# Patient Record
Sex: Male | Born: 1970 | Race: White | Hispanic: No | Marital: Married | Smoking: Never smoker
Health system: Southern US, Community
[De-identification: ages and names within clinical notes are randomized; demographics above are authoritative.]

## PROBLEM LIST (undated history)

## (undated) DIAGNOSIS — K219 Gastro-esophageal reflux disease without esophagitis: Secondary | ICD-10-CM

## (undated) DIAGNOSIS — R519 Headache, unspecified: Secondary | ICD-10-CM

## (undated) DIAGNOSIS — I10 Essential (primary) hypertension: Secondary | ICD-10-CM

## (undated) DIAGNOSIS — M1990 Unspecified osteoarthritis, unspecified site: Secondary | ICD-10-CM

## (undated) HISTORY — PX: CARPAL TUNNEL RELEASE: SHX101

---

## 2010-04-24 ENCOUNTER — Emergency Department: Payer: Self-pay | Admitting: Emergency Medicine

## 2010-05-21 ENCOUNTER — Ambulatory Visit: Payer: Self-pay | Admitting: Family Medicine

## 2013-12-02 ENCOUNTER — Ambulatory Visit: Payer: Self-pay | Admitting: Physician Assistant

## 2014-01-09 ENCOUNTER — Emergency Department: Payer: Self-pay | Admitting: Emergency Medicine

## 2014-01-09 LAB — CBC WITH DIFFERENTIAL/PLATELET
Basophil #: 0 10*3/uL (ref 0.0–0.1)
Basophil %: 0.5 %
EOS PCT: 2.2 %
Eosinophil #: 0.1 10*3/uL (ref 0.0–0.7)
HCT: 45.5 % (ref 40.0–52.0)
HGB: 15.5 g/dL (ref 13.0–18.0)
Lymphocyte #: 1.8 10*3/uL (ref 1.0–3.6)
Lymphocyte %: 30.9 %
MCH: 29.7 pg (ref 26.0–34.0)
MCHC: 34.1 g/dL (ref 32.0–36.0)
MCV: 87 fL (ref 80–100)
MONO ABS: 0.5 x10 3/mm (ref 0.2–1.0)
MONOS PCT: 9 %
Neutrophil #: 3.3 10*3/uL (ref 1.4–6.5)
Neutrophil %: 57.4 %
Platelet: 204 10*3/uL (ref 150–440)
RBC: 5.24 10*6/uL (ref 4.40–5.90)
RDW: 13.4 % (ref 11.5–14.5)
WBC: 5.8 10*3/uL (ref 3.8–10.6)

## 2014-01-09 LAB — URINALYSIS, COMPLETE
BACTERIA: NONE SEEN
BILIRUBIN, UR: NEGATIVE
Blood: NEGATIVE
GLUCOSE, UR: NEGATIVE mg/dL (ref 0–75)
KETONE: NEGATIVE
LEUKOCYTE ESTERASE: NEGATIVE
Nitrite: NEGATIVE
PROTEIN: NEGATIVE
Ph: 7 (ref 4.5–8.0)
RBC, UR: NONE SEEN /HPF (ref 0–5)
SPECIFIC GRAVITY: 1.004 (ref 1.003–1.030)
Squamous Epithelial: NONE SEEN
WBC UR: NONE SEEN /HPF (ref 0–5)

## 2014-01-09 LAB — COMPREHENSIVE METABOLIC PANEL
AST: 38 U/L — AB (ref 15–37)
Albumin: 3.8 g/dL (ref 3.4–5.0)
Alkaline Phosphatase: 96 U/L
Anion Gap: 1 — ABNORMAL LOW (ref 7–16)
BUN: 11 mg/dL (ref 7–18)
Bilirubin,Total: 0.3 mg/dL (ref 0.2–1.0)
CALCIUM: 9.2 mg/dL (ref 8.5–10.1)
Chloride: 105 mmol/L (ref 98–107)
Co2: 31 mmol/L (ref 21–32)
Creatinine: 0.97 mg/dL (ref 0.60–1.30)
EGFR (African American): >60
GLUCOSE: 85 mg/dL (ref 65–99)
OSMOLALITY: 272 (ref 275–301)
Potassium: 4.2 mmol/L (ref 3.5–5.1)
SGPT (ALT): 70 U/L (ref 12–78)
Sodium: 137 mmol/L (ref 136–145)
TOTAL PROTEIN: 7.5 g/dL (ref 6.4–8.2)

## 2014-01-09 LAB — LIPASE, BLOOD: Lipase: 142 U/L (ref 73–393)

## 2014-01-09 LAB — TROPONIN I: Troponin-I: <0.02 ng/mL

## 2014-01-10 ENCOUNTER — Encounter: Payer: Self-pay | Admitting: Emergency Medicine

## 2014-01-20 ENCOUNTER — Encounter: Payer: Self-pay | Admitting: Emergency Medicine

## 2014-01-21 ENCOUNTER — Emergency Department: Payer: Self-pay | Admitting: Emergency Medicine

## 2014-01-21 LAB — CBC WITH DIFFERENTIAL/PLATELET
Basophil #: 0 10*3/uL (ref 0.0–0.1)
Basophil %: 0.5 %
Eosinophil #: 0.1 10*3/uL (ref 0.0–0.7)
Eosinophil %: 2 %
HCT: 45.5 % (ref 40.0–52.0)
HGB: 15.6 g/dL (ref 13.0–18.0)
Lymphocyte #: 1.9 10*3/uL (ref 1.0–3.6)
Lymphocyte %: 29.3 %
MCH: 30.1 pg (ref 26.0–34.0)
MCHC: 34.3 g/dL (ref 32.0–36.0)
MCV: 88 fL (ref 80–100)
Monocyte #: 0.5 x10 3/mm (ref 0.2–1.0)
Monocyte %: 8.4 %
Neutrophil #: 3.9 10*3/uL (ref 1.4–6.5)
Neutrophil %: 59.8 %
Platelet: 203 10*3/uL (ref 150–440)
RBC: 5.2 10*6/uL (ref 4.40–5.90)
RDW: 13.4 % (ref 11.5–14.5)
WBC: 6.5 10*3/uL (ref 3.8–10.6)

## 2014-01-21 LAB — COMPREHENSIVE METABOLIC PANEL
Albumin: 4.1 g/dL (ref 3.4–5.0)
Alkaline Phosphatase: 95 U/L
Anion Gap: 5 — ABNORMAL LOW (ref 7–16)
BUN: 11 mg/dL (ref 7–18)
Bilirubin,Total: 0.4 mg/dL (ref 0.2–1.0)
Calcium, Total: 8.6 mg/dL (ref 8.5–10.1)
Chloride: 107 mmol/L (ref 98–107)
Co2: 27 mmol/L (ref 21–32)
Creatinine: 0.92 mg/dL (ref 0.60–1.30)
EGFR (African American): >60
EGFR (Non-African Amer.): >60
Glucose: 81 mg/dL (ref 65–99)
Osmolality: 276 (ref 275–301)
Potassium: 4 mmol/L (ref 3.5–5.1)
SGOT(AST): 39 U/L — ABNORMAL HIGH (ref 15–37)
SGPT (ALT): 63 U/L (ref 12–78)
Sodium: 139 mmol/L (ref 136–145)
Total Protein: 8.1 g/dL (ref 6.4–8.2)

## 2014-01-21 LAB — URINALYSIS, COMPLETE
BILIRUBIN, UR: NEGATIVE
Bacteria: NONE SEEN
Blood: NEGATIVE
Glucose,UR: NEGATIVE mg/dL (ref 0–75)
Ketone: NEGATIVE
Leukocyte Esterase: NEGATIVE
NITRITE: NEGATIVE
PH: 6 (ref 4.5–8.0)
Protein: NEGATIVE
RBC,UR: NONE SEEN /HPF (ref 0–5)
SPECIFIC GRAVITY: 1.004 (ref 1.003–1.030)
Squamous Epithelial: NONE SEEN
WBC UR: NONE SEEN /HPF (ref 0–5)

## 2014-01-21 LAB — LIPASE, BLOOD: LIPASE: 164 U/L (ref 73–393)

## 2014-02-14 ENCOUNTER — Ambulatory Visit: Payer: Self-pay | Admitting: Gastroenterology

## 2014-02-16 LAB — PATHOLOGY REPORT

## 2014-02-20 ENCOUNTER — Encounter: Payer: Self-pay | Admitting: Emergency Medicine

## 2014-03-22 ENCOUNTER — Encounter: Payer: Self-pay | Admitting: Emergency Medicine

## 2015-05-17 ENCOUNTER — Encounter: Payer: Self-pay | Admitting: Emergency Medicine

## 2015-05-17 ENCOUNTER — Emergency Department
Admission: EM | Admit: 2015-05-17 | Discharge: 2015-05-17 | Disposition: A | Discharge status: Home / Self Care | Payer: BLUE CROSS/BLUE SHIELD | Attending: Emergency Medicine | Admitting: Emergency Medicine

## 2015-05-17 ENCOUNTER — Emergency Department: Payer: BLUE CROSS/BLUE SHIELD

## 2015-05-17 DIAGNOSIS — W450XXA Nail entering through skin, initial encounter: Secondary | ICD-10-CM | POA: Diagnosis not present

## 2015-05-17 DIAGNOSIS — Y998 Other external cause status: Secondary | ICD-10-CM | POA: Insufficient documentation

## 2015-05-17 DIAGNOSIS — Y9289 Other specified places as the place of occurrence of the external cause: Secondary | ICD-10-CM | POA: Diagnosis not present

## 2015-05-17 DIAGNOSIS — Y9389 Activity, other specified: Secondary | ICD-10-CM | POA: Diagnosis not present

## 2015-05-17 DIAGNOSIS — T148XXA Other injury of unspecified body region, initial encounter: Secondary | ICD-10-CM

## 2015-05-17 DIAGNOSIS — I10 Essential (primary) hypertension: Secondary | ICD-10-CM | POA: Diagnosis not present

## 2015-05-17 DIAGNOSIS — S51032A Puncture wound without foreign body of left elbow, initial encounter: Secondary | ICD-10-CM | POA: Diagnosis not present

## 2015-05-17 HISTORY — DX: Essential (primary) hypertension: I10

## 2015-05-17 MED ORDER — KETOROLAC TROMETHAMINE 60 MG/2ML IM SOLN
INTRAMUSCULAR | Status: AC
  Administered 2015-05-17: 60 mg via INTRAMUSCULAR
  Filled 2015-05-17: qty 2

## 2015-05-17 MED ORDER — BACITRACIN ZINC 500 UNIT/GM EX OINT
TOPICAL_OINTMENT | CUTANEOUS | Status: AC
  Filled 2015-05-17: qty 0.9

## 2015-05-17 MED ORDER — CEPHALEXIN 500 MG PO CAPS: 500.0000 mg | ORAL_CAPSULE | Freq: Four times a day (QID) | ORAL | Status: DC

## 2015-05-17 MED ORDER — KETOROLAC TROMETHAMINE 60 MG/2ML IM SOLN
60.0000 mg | Freq: Once | INTRAMUSCULAR | Status: AC
  Administered 2015-05-17: 60 mg via INTRAMUSCULAR

## 2015-05-17 MED ORDER — IBUPROFEN 800 MG PO TABS: 800.0000 mg | ORAL_TABLET | Freq: Three times a day (TID) | ORAL | Status: DC | PRN

## 2015-05-17 NOTE — ED Notes (Signed)
When pulling shelving in his building, pt states a nail punctured the under side of his left upper arm, when he jerked back, he pulled the nail out and said there was a lot of blood. Pt appears pale. Site wrapped with a towel on initial assessment. When towel removed, puncture site small, bleeding controlled, swelling around area noted. Gauze placed on site, area wrapped with kerlex. Pt states tetanus shot is less than 45 years old.

## 2015-05-17 NOTE — Discharge Instructions (Signed)
Puncture Wound °A puncture wound is an injury that extends through all layers of the skin and into the tissue beneath the skin (subcutaneous tissue). Puncture wounds become infected easily because germs often enter the body and go beneath the skin during the injury. Having a deep wound with a small entrance point makes it difficult for your caregiver to adequately clean the wound. This is especially true if you have stepped on a nail and it has passed through a dirty shoe or other situations where the wound is obviously contaminated. °CAUSES  °Many puncture wounds involve glass, nails, splinters, fish hooks, or other objects that enter the skin (foreign bodies). A puncture wound may also be caused by a human bite or animal bite. °DIAGNOSIS  °A puncture wound is usually diagnosed by your history and a physical exam. You may need to have an X-ray or an ultrasound to check for any foreign bodies still in the wound. °TREATMENT  °· Your caregiver will clean the wound as thoroughly as possible. Depending on the location of the wound, a bandage (dressing) may be applied. °· Your caregiver might prescribe antibiotic medicines. °· You may need a follow-up visit to check on your wound. Follow all instructions as directed by your caregiver. °HOME CARE INSTRUCTIONS  °· Change your dressing once per day, or as directed by your caregiver. If the dressing sticks, it may be removed by soaking the area in water. °· If your caregiver has given you follow-up instructions, it is very important that you return for a follow-up appointment. Not following up as directed could result in a chronic or permanent injury, pain, and disability. °· Only take over-the-counter or prescription medicines for pain, discomfort, or fever as directed by your caregiver. °· If you are given antibiotics, take them as directed. Finish them even if you start to feel better. °You may need a tetanus shot if: °· You cannot remember when you had your last tetanus  shot. °· You have never had a tetanus shot. °If you got a tetanus shot, your arm may swell, get red, and feel warm to the touch. This is common and not a problem. If you need a tetanus shot and you choose not to have one, there is a rare chance of getting tetanus. Sickness from tetanus can be serious. °You may need a rabies shot if an animal bite caused your puncture wound. °SEEK MEDICAL CARE IF:  °· You have redness, swelling, or increasing pain in the wound. °· You have red streaks going away from the wound. °· You notice a bad smell coming from the wound or dressing. °· You have yellowish-white fluid (pus) coming from the wound. °· You are treated with an antibiotic for infection, but the infection is not getting better. °· You notice something in the wound, such as rubber from your shoe, cloth, or another object. °· You have a fever. °· You have severe pain. °· You have difficulty breathing. °· You feel dizzy or faint. °· You cannot stop vomiting. °· You lose feeling, develop numbness, or cannot move a limb below the wound. °· Your symptoms worsen. °MAKE SURE YOU: °· Understand these instructions. °· Will watch your condition. °· Will get help right away if you are not doing well or get worse. °Document Released: 08/18/2005 Document Revised: 01/31/2012 Document Reviewed: 04/27/2011 °ExitCare® Patient Information ©2015 [employer]. This information is not intended to replace advice given to you by your health care provider. Make sure you discuss any questions you   have with your health care provider. ° °

## 2015-05-17 NOTE — ED Provider Notes (Signed)
[facility] Emergency Department Provider Note  ____________________________________________  Time seen: Approximately 1:51 PM  I have reviewed the triage vital signs and the nursing notes.   HISTORY  Chief Complaint Arm Injury    HPI Alan Chapman. is a 44 y.o. male who presents for evaluation for puncture wound to his left upper arm. Patient states that he jerked his arm back after hitting the base of the nail. complains of increased pain and around the elbow.   Past Medical History  Diagnosis Date  . Hypertension     There are no active problems to display for this patient.   Past Surgical History  Procedure Laterality Date  . Carpal tunnel release      right hand    Current Outpatient Rx  Name  Route  Sig  Dispense  Refill  . cephALEXin (KEFLEX) 500 MG capsule   Oral   Take 1 capsule (500 mg total) by mouth 4 (four) times daily.   40 capsule   0   . ibuprofen (ADVIL,MOTRIN) 800 MG tablet   Oral   Take 1 tablet (800 mg total) by mouth every 8 (eight) hours as needed.   30 tablet   0     Allergies Review of patient's allergies indicates no known allergies.  No family history on file.  Social History History  Substance Use Topics  . Smoking status: Never Smoker   . Smokeless tobacco: Not on file  . Alcohol Use: No    Review of Systems Constitutional: No fever/chills Eyes: No visual changes. ENT: No sore throat. Cardiovascular: Denies chest pain. Respiratory: Denies shortness of breath. Gastrointestinal: No abdominal pain.  No nausea, no vomiting.  No diarrhea.  No constipation. Genitourinary: Negative for dysuria. Musculoskeletal: Left elbow pain Skin: Positive for puncture wound left elbow Neurological: Negative for headaches, focal weakness or numbness.  10-point ROS otherwise negative.  ____________________________________________   PHYSICAL EXAM:  VITAL SIGNS: ED Triage Vitals  Enc Vitals Group   BP 05/17/15 1245 129/87 mmHg     Pulse Rate 05/17/15 1245 70     Resp 05/17/15 1245 18     Temp 05/17/15 1245 98 F (36.7 C)     Temp Source 05/17/15 1245 Oral     SpO2 05/17/15 1245 96 %     Weight 05/17/15 1245 230 lb (104.327 kg)     Height 05/17/15 1245  (1.727 m)     Head Cir --      Peak Flow --      Pain Score 05/17/15 1243 5     Pain Loc --      Pain Edu? --      Excl. in GC? --     Constitutional: Alert and oriented. Well appearing and in no acute distress. Musculoskeletal: No lower extremity tenderness nor edema.  No joint effusions. Neurologic:  Normal speech and language. No gross focal neurologic deficits are appreciated. Speech is normal. No gait instability. Skin:  Puncture wound left elbow pain control and no erythema positive ecchymosis noted Psychiatric: Mood and affect are normal. Speech and behavior are normal.  ____________________________________________   LABS (all labs ordered are listed, but only abnormal results are displayed)  Labs Reviewed - No data to display ____________________________________________  EKG  Deferred ____________________________________________  RADIOLOGY  Negative fracture by radiologist reviewed by myself ____________________________________________   PROCEDURES  Procedure(s) performed: None  Critical Care performed: No  ____________________________________________   INITIAL IMPRESSION / ASSESSMENT AND PLAN /  ED COURSE  Pertinent labs & imaging results that were available during my care of the patient were reviewed by me and considered in my medical decision making (see chart for details).  Puncture wound cleansed and bandaid applied with bacitracin by myself. Discharged home to follow-up as needed. ____________________________________________   FINAL CLINICAL IMPRESSION(S) / ED DIAGNOSES  Final diagnoses:  Puncture wound      Evangeline Dakin, PA-C 05/17/15 1547  Sharman Cheek, MD 05/17/15  1550

## 2015-10-26 IMAGING — US ABDOMEN ULTRASOUND LIMITED
1 series · 14 of 25 positions shown · non-contrast
Comparison: None.

CLINICAL DATA: Right upper quadrant abdominal pain.

EXAM:
US ABDOMEN LIMITED - RIGHT UPPER QUADRANT

[Series 1: abdomen ultrasound limited · 0.27mm/px · 14 of 41 slices shown]
[im 1/41]
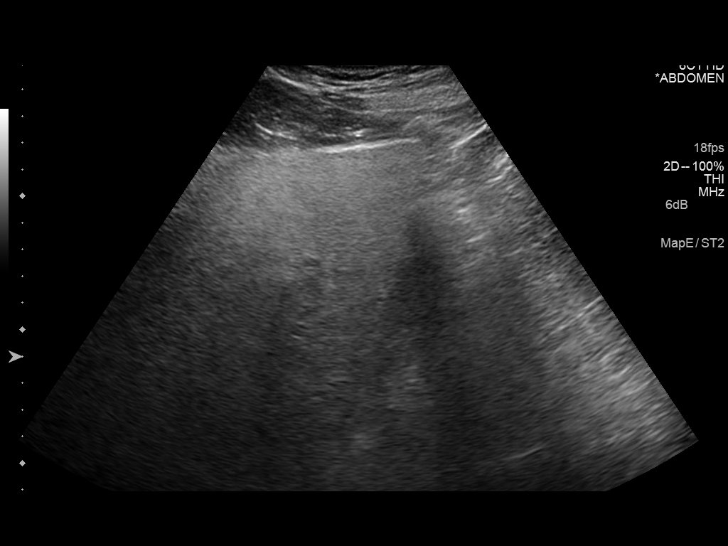
[im 4/41]
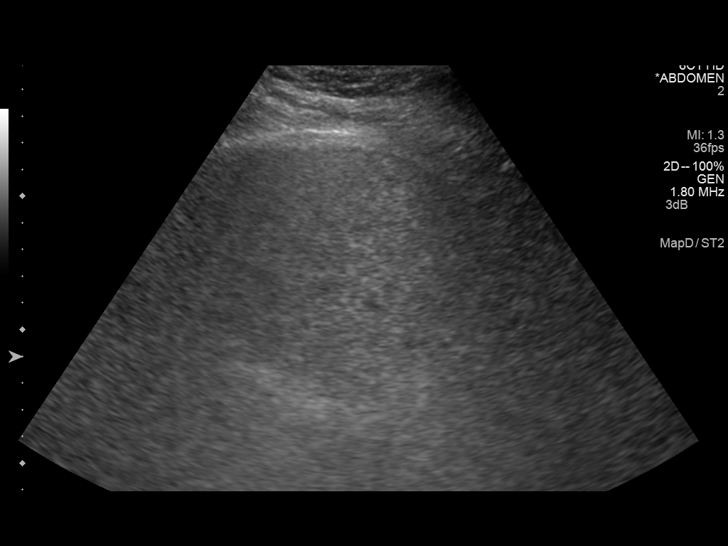
[im 7/41]
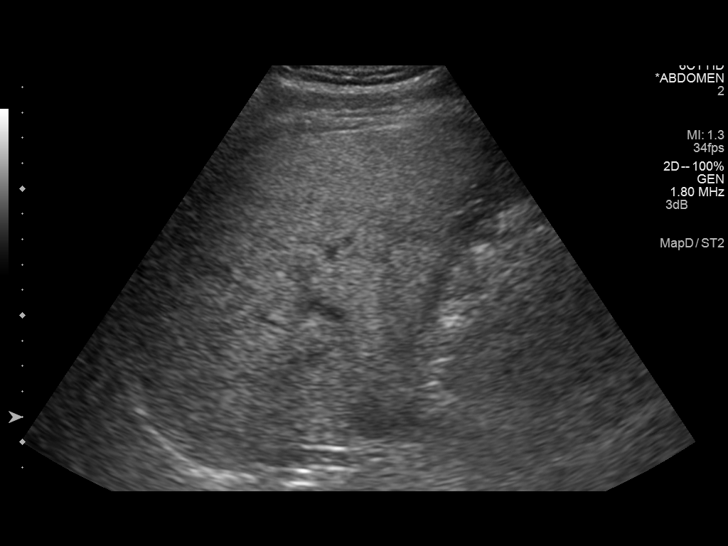
[im 11/41]
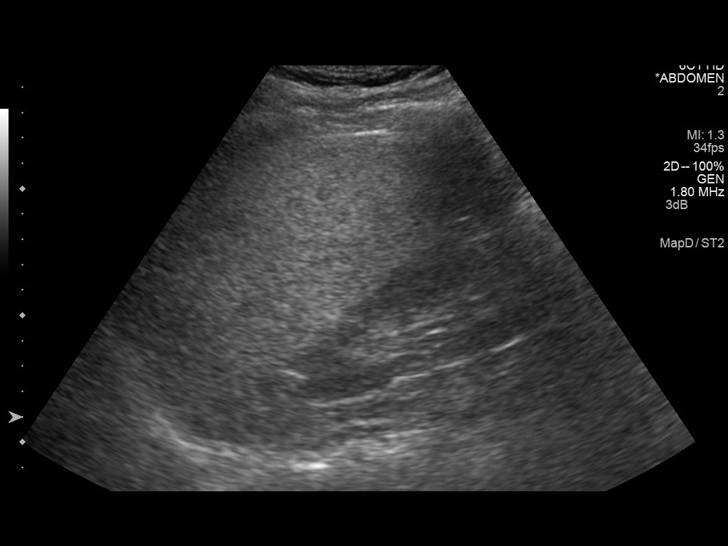
[im 14/41]
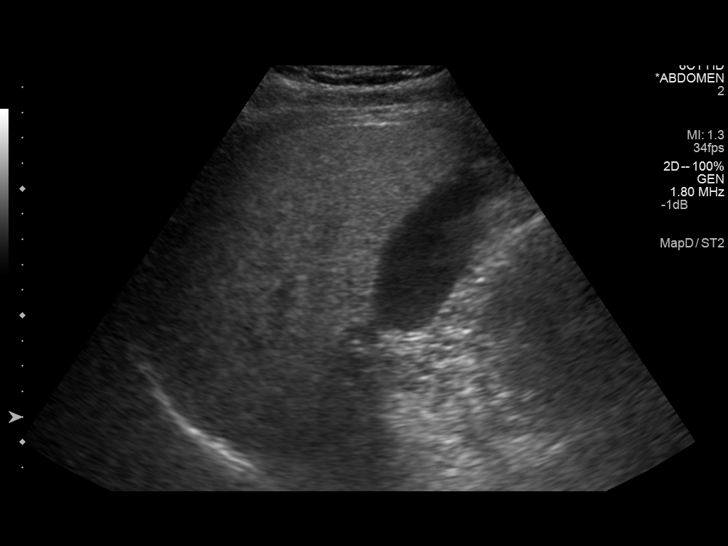
[im 16/41]
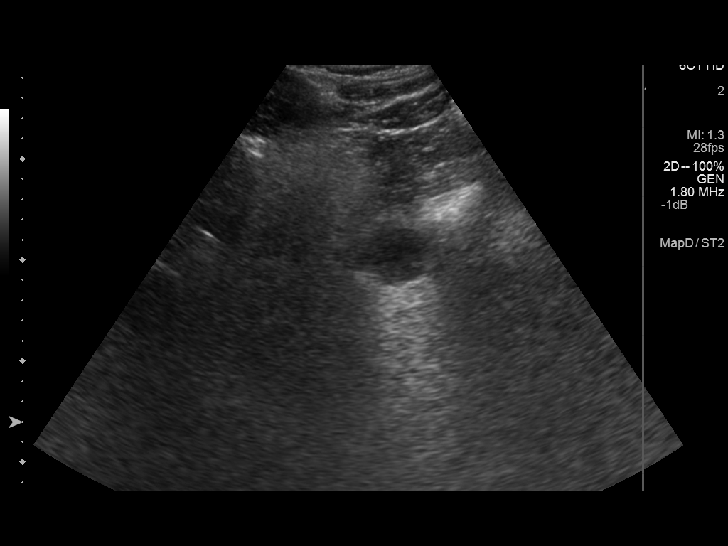
[im 19/41]
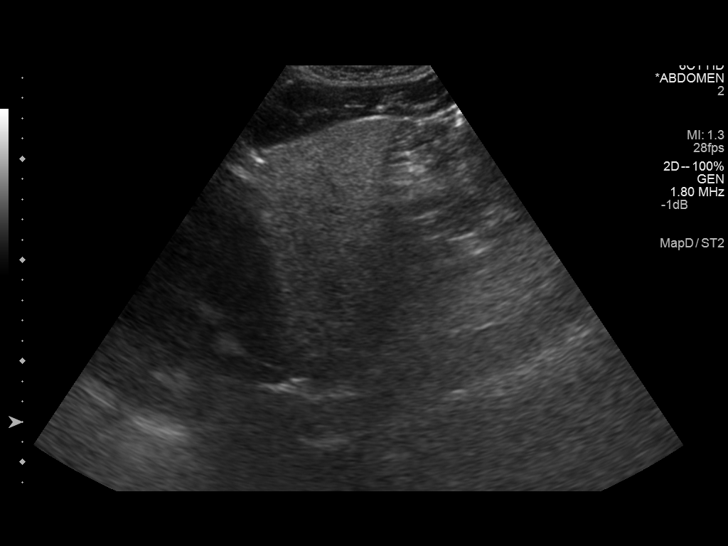
[im 22/41]
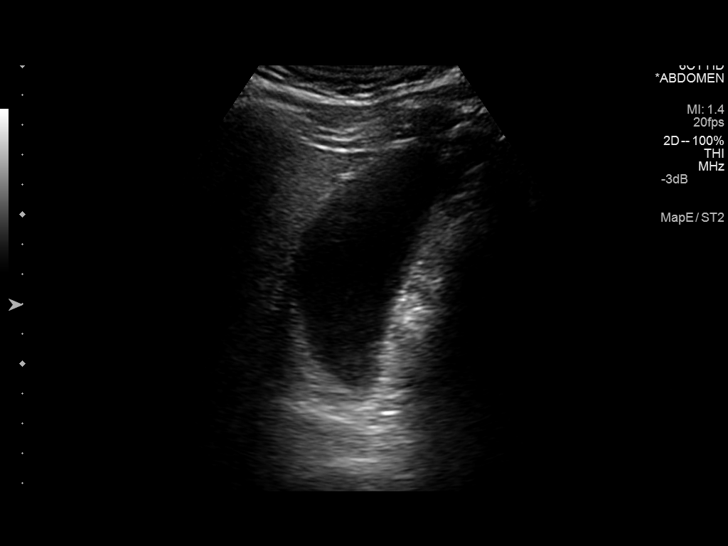
[im 26/41]
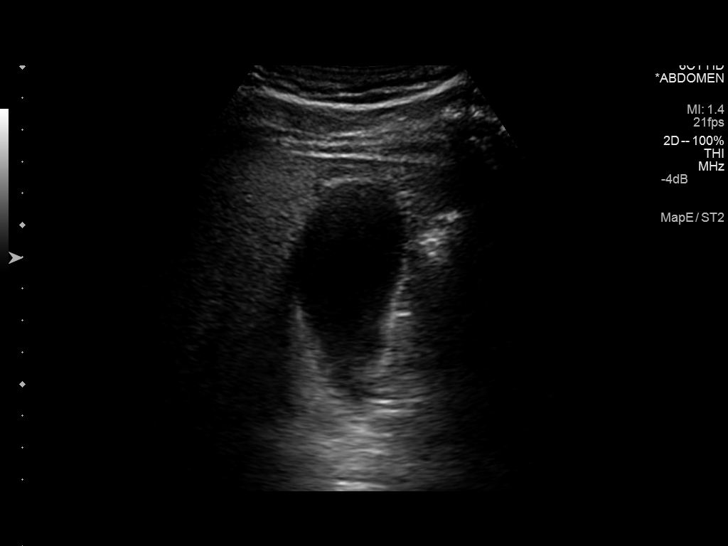
[im 27/41]
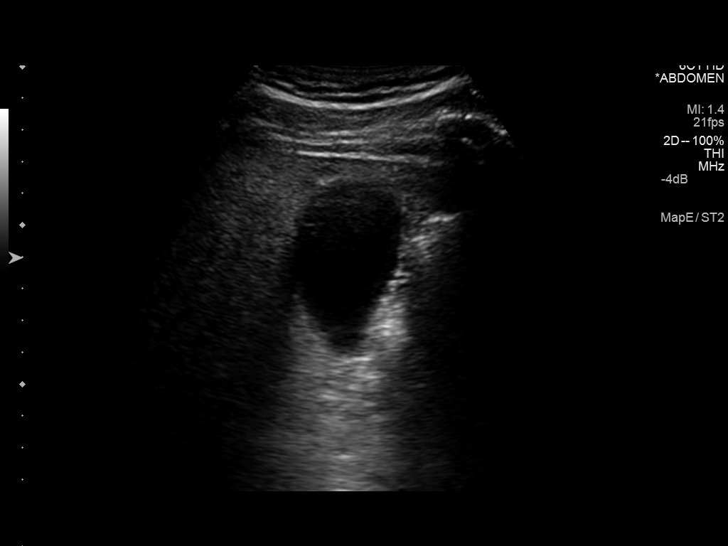
[im 31/41]
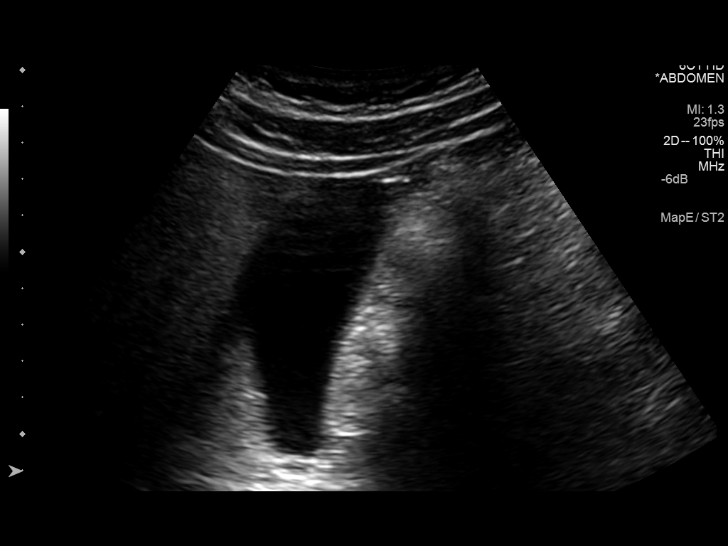
[im 34/41]
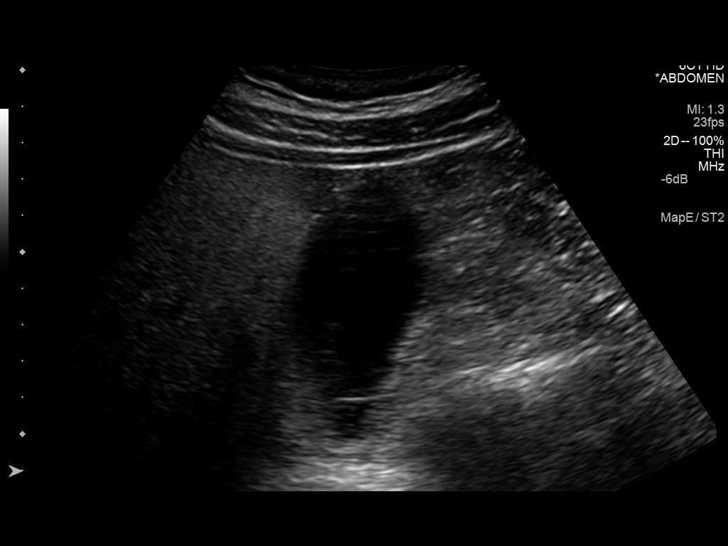
[im 37/41]
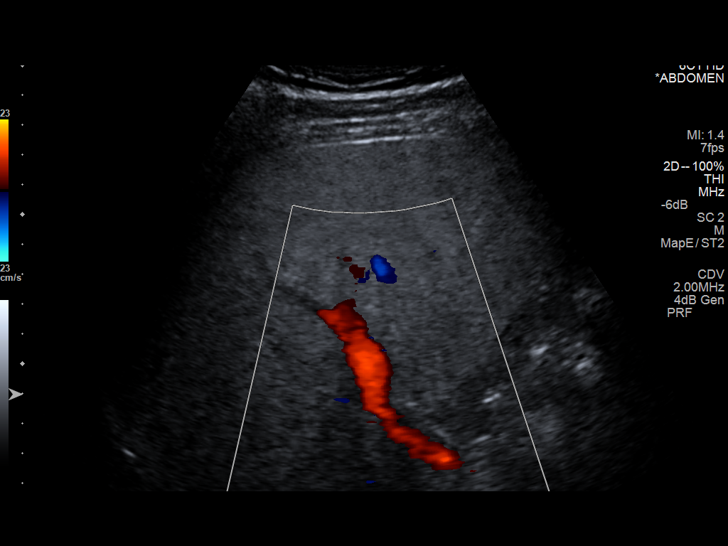
[im 41/41]
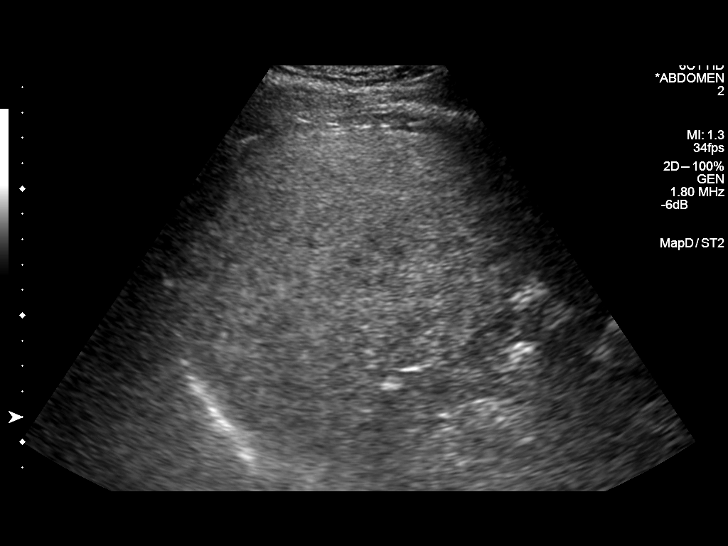

[14 of 25 positions shown; findings below may reference images not displayed]

FINDINGS: Gallbladder:

No gallstones or wall thickening visualized. No sonographic Murphy
sign noted. There may be a trace amount of biliary sludge in the
dependent gallbladder.

Common bile duct:

Diameter: Normal caliber of 5 mm.

Liver:

Diffusely increased echogenicity of the liver identified, consistent
with steatosis. No overt cirrhotic changes are identified. No
evidence of hepatic mass or biliary ductal dilatation. The portal
vein is open.
IMPRESSION: 1. No shadowing calculi in the gallbladder. There may be a trace
amount of biliary sludge in the gallbladder lumen.
2. Diffusely increased echogenicity of the liver consistent with
steatosis.

## 2020-02-04 ENCOUNTER — Other Ambulatory Visit: Payer: Self-pay

## 2020-02-04 ENCOUNTER — Emergency Department
Admission: EM | Admit: 2020-02-04 | Discharge: 2020-02-04 | Disposition: A | Discharge status: Home / Self Care | Payer: BC Managed Care – PPO | Attending: Emergency Medicine | Admitting: Emergency Medicine

## 2020-02-04 ENCOUNTER — Emergency Department: Payer: BC Managed Care – PPO

## 2020-02-04 ENCOUNTER — Encounter: Payer: Self-pay | Admitting: Emergency Medicine

## 2020-02-04 DIAGNOSIS — I10 Essential (primary) hypertension: Secondary | ICD-10-CM | POA: Insufficient documentation

## 2020-02-04 DIAGNOSIS — R42 Dizziness and giddiness: Secondary | ICD-10-CM | POA: Diagnosis not present

## 2020-02-04 DIAGNOSIS — R519 Headache, unspecified: Secondary | ICD-10-CM | POA: Insufficient documentation

## 2020-02-04 DIAGNOSIS — Z79899 Other long term (current) drug therapy: Secondary | ICD-10-CM | POA: Insufficient documentation

## 2020-02-04 LAB — CBC
HCT: 47.3 % (ref 39.0–52.0)
Hemoglobin: 16.2 g/dL (ref 13.0–17.0)
MCH: 29.9 pg (ref 26.0–34.0)
MCHC: 34.2 g/dL (ref 30.0–36.0)
MCV: 87.4 fL (ref 80.0–100.0)
Platelets: 219 10*3/uL (ref 150–400)
RBC: 5.41 MIL/uL (ref 4.22–5.81)
RDW: 12.4 % (ref 11.5–15.5)
WBC: 5.7 10*3/uL (ref 4.0–10.5)
nRBC: 0 % (ref 0.0–0.2)

## 2020-02-04 LAB — URINALYSIS, COMPLETE (UACMP) WITH MICROSCOPIC
Bacteria, UA: NONE SEEN
Bilirubin Urine: NEGATIVE
Glucose, UA: NEGATIVE mg/dL
Hgb urine dipstick: NEGATIVE
Ketones, ur: NEGATIVE mg/dL
Leukocytes,Ua: NEGATIVE
Nitrite: NEGATIVE
Protein, ur: NEGATIVE mg/dL
Specific Gravity, Urine: 1.015 (ref 1.005–1.030)
Squamous Epithelial / HPF: NONE SEEN (ref 0–5)
pH: 7 (ref 5.0–8.0)

## 2020-02-04 LAB — BASIC METABOLIC PANEL
Anion gap: 8 (ref 5–15)
BUN: 11 mg/dL (ref 6–20)
CO2: 27 mmol/L (ref 22–32)
Calcium: 8.9 mg/dL (ref 8.9–10.3)
Chloride: 103 mmol/L (ref 98–111)
Creatinine, Ser: 1 mg/dL (ref 0.61–1.24)
GFR calc Af Amer: >60 mL/min (ref >60)
GFR calc non Af Amer: >60 mL/min (ref >60)
Glucose, Bld: 114 mg/dL — ABNORMAL HIGH (ref 70–99)
Potassium: 4.3 mmol/L (ref 3.5–5.1)
Sodium: 138 mmol/L (ref 135–145)

## 2020-02-04 LAB — GLUCOSE, CAPILLARY: Glucose-Capillary: 115 mg/dL — ABNORMAL HIGH (ref 70–99)

## 2020-02-04 MED ORDER — MECLIZINE HCL 25 MG PO TABS
25.0000 mg | ORAL_TABLET | Freq: Once | ORAL | Status: AC
  Administered 2020-02-04: 18:00:00 25 mg via ORAL
  Filled 2020-02-04: qty 1

## 2020-02-04 MED ORDER — SODIUM CHLORIDE 0.9 % IV BOLUS
1000.0000 mL | Freq: Once | INTRAVENOUS | Status: AC
  Administered 2020-02-04: 1000 mL via INTRAVENOUS

## 2020-02-04 MED ORDER — PREDNISONE 20 MG PO TABS
40.0000 mg | ORAL_TABLET | Freq: Once | ORAL | Status: AC
  Administered 2020-02-04: 40 mg via ORAL
  Filled 2020-02-04: qty 2

## 2020-02-04 MED ORDER — PREDNISONE 20 MG PO TABS: 40.0000 mg | ORAL_TABLET | Freq: Every day | ORAL | 0 refills | Status: AC

## 2020-02-04 MED ORDER — MECLIZINE HCL 25 MG PO TABS: 25.0000 mg | ORAL_TABLET | Freq: Three times a day (TID) | ORAL | 0 refills | Status: DC | PRN

## 2020-02-04 MED ORDER — DIAZEPAM 5 MG PO TABS
5.0000 mg | ORAL_TABLET | Freq: Once | ORAL | Status: AC
  Administered 2020-02-04: 5 mg via ORAL
  Filled 2020-02-04: qty 1

## 2020-02-04 MED ORDER — DIAZEPAM 5 MG PO TABS: 5.0000 mg | ORAL_TABLET | Freq: Two times a day (BID) | ORAL | 0 refills | Status: DC | PRN

## 2020-02-04 NOTE — ED Notes (Signed)
Pt ambulated to toilet without assistance with steady gait

## 2020-02-04 NOTE — ED Notes (Signed)
Pt speaking with Dr. Erma Heritage in no acute distress, speech clear. Pt reports if he is laying flat he feels fine but when he gets up he feels dizzy

## 2020-02-04 NOTE — Discharge Instructions (Signed)
Do not drive until symptom free for at least 48 hours  For your vertigo: - Take the prednisone as prescribed - For mild dizziness, take the meclizine - For SEVERE dizziness, take the valium and lie down. DO NOT DRIVE while taking either of these medications  Call an ENT for follow-up

## 2020-02-04 NOTE — ED Triage Notes (Signed)
Pt c/o dizziness starting today when he woke up.  Went to bed at 330 AM and did not have this.  Describes it as the room spinning.  Feels off balance walking because of the dizziness. Denies numbness, tingling or weakness.  Mild headache present.  Speech clear. No facial droop.

## 2020-02-04 NOTE — ED Provider Notes (Signed)
[facility] Emergency Department Provider Note  ____________________________________________   First MD Initiated Contact with Patient 02/04/20 1546     (approximate)  I have reviewed the triage vital signs and the nursing notes.   HISTORY  Chief Complaint Dizziness and Headache    HPI Alan Chapman. is a 49 y.o. male here with dizziness.  The patient states that for the last day, he has had episodes of severe dizziness whenever he sits up, changes position, or moves his head quickly.  He states that he begins to feel like the room is spinning.  He feels like he is off balance.  He has associated nausea.  His symptoms are new.  He denies any recent medication changes.  No recent head trauma.  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  He has not seen an ENT for this.  He states that he has been having some mild increase in pressure in the left ear recently as well.  Denies any recent sinus infections.  No fevers or chills.  No specific alleviating factors other than staying still.        Past Medical History:  Diagnosis Date  . Hypertension     There are no problems to display for this patient.   Past Surgical History:  Procedure Laterality Date  . CARPAL TUNNEL RELEASE     right hand    Prior to Admission medications   Medication Sig Start Date End Date Taking? Authorizing Provider  famotidine (PEPCID) 40 MG tablet Take 40 mg by mouth daily. 12/13/19  Yes [provider]  loratadine (CLARITIN) 10 MG tablet Take 10 mg by mouth daily.   Yes [provider]  losartan (COZAAR) 100 MG tablet Take 100 mg by mouth daily. 12/13/19  Yes [provider]  Multiple Vitamins-Minerals (MENS DAILY FORMULA/LYCOPENE PO) Take 1 tablet by mouth daily.   Yes [provider]  naproxen sodium (ALEVE) 220 MG tablet Take 220-440 mg by mouth 2 (two) times daily as needed (pain).   Yes [provider]    Allergies Patient has no known allergies.  History reviewed. No pertinent family history.  Social History Social History   Tobacco Use  . Smoking status: Never Smoker  . Smokeless tobacco: Never Used  Substance Use Topics  . Alcohol use: No  . Drug use: No    Review of Systems  Review of Systems  Constitutional: Positive for fatigue. Negative for chills and fever.  HENT: Negative for sore throat.   Respiratory: Negative for shortness of breath.   Cardiovascular: Negative for chest pain.  Gastrointestinal: Negative for abdominal pain.  Genitourinary: Negative for flank pain.  Musculoskeletal: Negative for neck pain.  Skin: Negative for rash and wound.  Allergic/Immunologic: Negative for immunocompromised state.  Neurological: Positive for dizziness. Negative for weakness and numbness.  Hematological: Does not bruise/bleed easily.  All other systems reviewed and are negative.    ____________________________________________  PHYSICAL EXAM:      VITAL SIGNS: ED Triage Vitals  Enc Vitals Group     BP 02/04/20 1337 (!) 145/92     Pulse Rate 02/04/20 1335 73     Resp 02/04/20 1335 18     Temp 02/04/20 1335 98.4 F (36.9 C)     Temp Source 02/04/20 1335 Oral     SpO2 02/04/20 1335 95 %     Weight 02/04/20 1336 241 lb (109.3 kg)  Height 02/04/20 1336 5\' 8"  (1.727 m)     Head Circumference --      Peak Flow --      Pain Score 02/04/20 1335 2     Pain Loc --      Pain Edu? --      Excl. in GC? --      Physical Exam Vitals and nursing note reviewed.  Constitutional:      General: He is not in acute distress.    Appearance: He is well-developed.  HENT:     Head: Normocephalic and atraumatic.  Eyes:     Conjunctiva/sclera: Conjunctivae normal.  Cardiovascular:     Rate and Rhythm: Normal rate and regular rhythm.     Heart sounds: Normal heart sounds.  Pulmonary:     Effort: Pulmonary effort is normal. No respiratory distress.     Breath  sounds: No wheezing.  Abdominal:     General: There is no distension.  Musculoskeletal:     Cervical back: Neck supple.  Skin:    General: Skin is warm.     Capillary Refill: Capillary refill takes less than 2 seconds.     Findings: No rash.  Neurological:     Mental Status: He is alert and oriented to person, place, and time.     Motor: No abnormal muscle tone.     Comments: Neurological Exam:  Mental Status: Alert and oriented to person, place, and time. Attention and concentration normal. Speech clear. Recent memory is intact. Cranial Nerves: Visual fields grossly intact. EOMI and PERRLA. No nystagmus noted. Facial sensation intact at forehead, maxillary cheek, and chin/mandible bilaterally. No facial asymmetry or weakness. Hearing grossly normal. Uvula is midline, and palate elevates symmetrically. Normal SCM and trapezius strength. Tongue midline without fasciculations. Motor: Muscle strength 5/5 in proximal and distal UE and LE bilaterally. No pronator drift. Muscle tone normal.  Sensation: Intact to light touch in upper and lower extremities distally bilaterally.  Gait: Normal without ataxia. Coordination: Normal FTN bilaterally.        On Dix-Hallpike testing, leftward beating, unidirectional nystagmus noted with latency and fatigue.  ____________________________________________   LABS (all labs ordered are listed, but only abnormal results are displayed)  Labs Reviewed  BASIC METABOLIC PANEL - Abnormal; Notable for the following components:      Result Value   Glucose, Bld 114 (*)    All other components within normal limits  URINALYSIS, COMPLETE (UACMP) WITH MICROSCOPIC - Abnormal; Notable for the following components:   Color, Urine YELLOW (*)    APPearance CLEAR (*)    All other components within normal limits  GLUCOSE, CAPILLARY - Abnormal; Notable for the following components:   Glucose-Capillary 115 (*)    All other components within normal limits  CBC  CBG  MONITORING, ED    ____________________________________________  EKG: Normal sinus rhythm, ventricular rate 64.  PR 162, QRS 74, QTc 389.  No acute ST elevations or depressions.  No ischemia or infarct. ________________________________________  RADIOLOGY All imaging, including plain films, CT scans, and ultrasounds, independently reviewed by me, and interpretations confirmed via formal radiology reads.  ED MD interpretation:   CT head: Negative  Official radiology report(s): CT Head Wo Contrast  Result Date: 02/04/2020 CLINICAL DATA:  Vertigo. EXAM: CT HEAD WITHOUT CONTRAST TECHNIQUE: Contiguous axial images were obtained from the base of the skull through the vertex without intravenous contrast. COMPARISON:  None. FINDINGS: Brain: No evidence of acute infarction, hemorrhage, hydrocephalus, extra-axial collection or mass lesion/mass  effect. Vascular: No hyperdense vessel or unexpected calcification. Skull: Normal. Negative for fracture or focal lesion. Sinuses/Orbits: No acute finding. Other: None. IMPRESSION: Normal head CT. Electronically Signed   By: Marijo Conception M.D.   On: 02/04/2020 16:54    ____________________________________________  PROCEDURES   Procedure(s) performed (including Critical Care):  Procedures  ____________________________________________  INITIAL IMPRESSION / MDM / [place] / ED COURSE  As part of my medical decision making, I reviewed the following data within the [place] notes reviewed and incorporated, Old chart reviewed, Notes from prior ED visits, and [place] Controlled Substance [place]. was evaluated in Emergency Department on 02/04/2020 for the symptoms described in the history of present illness. He was evaluated in the context of the global COVID-19 pandemic, which necessitated consideration that the patient might be at risk for infection with the SARS-CoV-2 virus that causes  COVID-19. Institutional protocols and algorithms that pertain to the evaluation of patients at risk for COVID-19 are in a state of rapid change based on information released by regulatory bodies including the CDC and federal and state organizations. These policies and algorithms were followed during the patient's care in the ED.  Some ED evaluations and interventions may be delayed as a result of limited staffing during the pandemic.*     Medical Decision Making: 49 year old male here with positional vertigo.  On Dix-Hallpike testing, he has unidirectional, horizontal nystagmus with latent onset that fatigues over several minutes.  He has no double vision, difficulty speaking, difficulty swallowing, ataxia, dysmetria, or evidence to suggest cerebellar abnormality and CT head is negative.  He has a lifelong history of tinnitus and intermittent ear pressure which I think is likely contributing to his middle ear dysfunction, possibly Mnire's disease.  He has demonstrated excellent response to prednisone and Valium here.  Will treat his vertigo as an outpatient and refer him for ENT follow-up.  Labs reassuring, lytes acceptable. No high risk medication use. No high risk features for stroke/central etiology. ____________________________________________  FINAL CLINICAL IMPRESSION(S) / ED DIAGNOSES  Final diagnoses:  None     MEDICATIONS GIVEN DURING THIS VISIT:  Medications  sodium chloride 0.9 % bolus 1,000 mL (0 mLs Intravenous Stopped 02/04/20 1820)  diazepam (VALIUM) tablet 5 mg (5 mg Oral Given 02/04/20 1624)  predniSONE (DELTASONE) tablet 40 mg (40 mg Oral Given 02/04/20 1624)  meclizine (ANTIVERT) tablet 25 mg (25 mg Oral Given 02/04/20 1823)     ED Discharge Orders    None       Note:  This document was prepared using Dragon voice recognition software and may include unintentional dictation errors.   Duffy Bruce, MD 02/04/20 [place]

## 2020-02-04 NOTE — ED Notes (Signed)
Pt ambulated to toilet with steady gait 

## 2020-02-04 NOTE — ED Notes (Signed)
Report given to Rebekah, RN 

## 2020-03-18 ENCOUNTER — Other Ambulatory Visit: Payer: Self-pay | Admitting: Neurology

## 2020-03-18 DIAGNOSIS — R42 Dizziness and giddiness: Secondary | ICD-10-CM

## 2020-03-24 ENCOUNTER — Ambulatory Visit
Admission: RE | Admit: 2020-03-24 | Discharge: 2020-03-24 | Disposition: A | Discharge status: Home / Self Care | Payer: BC Managed Care – PPO | Source: Ambulatory Visit | Attending: Neurology | Admitting: Neurology

## 2020-03-24 ENCOUNTER — Other Ambulatory Visit: Payer: Self-pay

## 2020-03-24 DIAGNOSIS — R42 Dizziness and giddiness: Secondary | ICD-10-CM | POA: Diagnosis present

## 2020-03-24 LAB — CBC WITH DIFFERENTIAL/PLATELET
Abs Immature Granulocytes: 0.01 10*3/uL (ref 0.00–0.07)
Basophils Absolute: 0 10*3/uL (ref 0.0–0.1)
Basophils Relative: 1 %
Eosinophils Absolute: 0.3 10*3/uL (ref 0.0–0.5)
Eosinophils Relative: 5 %
HCT: 42.5 % (ref 39.0–52.0)
Hemoglobin: 14.5 g/dL (ref 13.0–17.0)
Immature Granulocytes: 0 %
Lymphocytes Relative: 33 %
Lymphs Abs: 1.8 10*3/uL (ref 0.7–4.0)
MCH: 30.1 pg (ref 26.0–34.0)
MCHC: 34.1 g/dL (ref 30.0–36.0)
MCV: 88.2 fL (ref 80.0–100.0)
Monocytes Absolute: 0.5 10*3/uL (ref 0.1–1.0)
Monocytes Relative: 9 %
Neutro Abs: 2.8 10*3/uL (ref 1.7–7.7)
Neutrophils Relative %: 52 %
Platelets: 207 10*3/uL (ref 150–400)
RBC: 4.82 MIL/uL (ref 4.22–5.81)
RDW: 12.7 % (ref 11.5–15.5)
WBC: 5.4 10*3/uL (ref 4.0–10.5)
nRBC: 0 % (ref 0.0–0.2)

## 2020-03-24 LAB — PROTIME-INR
INR: 1 (ref 0.8–1.2)
Prothrombin Time: 12.7 seconds (ref 11.4–15.2)

## 2020-03-24 LAB — GLUCOSE, CSF: Glucose, CSF: 64 mg/dL (ref 40–70)

## 2020-03-24 LAB — CSF CELL COUNT WITH DIFFERENTIAL
RBC Count, CSF: 0 /mm3 (ref 0–3)
Tube #: 3
WBC, CSF: 0 /mm3 (ref 0–5)

## 2020-03-24 LAB — APTT: aPTT: 38 seconds — ABNORMAL HIGH (ref 24–36)

## 2020-03-24 LAB — GLUCOSE, RANDOM: Glucose, Bld: 95 mg/dL (ref 70–99)

## 2020-03-24 LAB — PATHOLOGIST SMEAR REVIEW

## 2020-03-24 LAB — PROTEIN, CSF: Total  Protein, CSF: 40 mg/dL (ref 15–45)

## 2020-03-24 MED ORDER — ACETAMINOPHEN 325 MG PO TABS: 650.0000 mg | ORAL_TABLET | ORAL | Status: DC | PRN

## 2020-03-24 NOTE — Progress Notes (Signed)
PT eating at this time, NAD noted. Denies any pain. PT hob elevated to 30 degrees while eating

## 2020-03-24 NOTE — Discharge Instructions (Signed)
Lumbar Puncture, Care After This sheet gives you information about how to care for yourself after your procedure. Your health care provider may also give you more specific instructions. If you have problems or questions, contact your health care provider. What can I expect after the procedure? After the procedure, it is common to have:  Mild discomfort or pain at the puncture site.  A mild headache that is relieved with pain medicines. Follow these instructions at home: Activity   Lie down flat or rest for as long as directed by your health care provider.  Return to your normal activities as told by your health care provider. Ask your health care provider what activities are safe for you.  Avoid lifting anything heavier than 10 lb (4.5 kg) for at least 12 hours after the procedure.  Do not drive for 24 hours if you were given a medicine to help you relax (sedative) during your procedure.  Do not drive or use heavy machinery while taking prescription pain medicine. Puncture site care  Remove or change your bandage (dressing) as told by your health care provider.  Check your puncture area every day for signs of infection. Check for: ? More pain. ? Redness or swelling. ? Fluid or blood leaking from the puncture site. ? Warmth. ? Pus or a bad smell. General instructions  Take over-the-counter and prescription medicines only as told by your health care provider.  Drink enough fluids to keep your urine clear or pale yellow. Your health care provider may recommend drinking caffeine to prevent a headache.  Keep all follow-up visits as told by your health care provider. This is important. Contact a health care provider if:  You have fever or chills.  You have nausea or vomiting.  You have a headache that lasts for more than 2 days or does not get better with medicine. Get help right away if:  You develop any of the following in your  legs: ? Weakness. ? Numbness. ? Tingling.  You are unable to control when you urinate or have a bowel movement (incontinence).  You have signs of infection around your puncture site, such as: ? More pain. ? Redness or swelling. ? Fluid or blood leakage. ? Warmth. ? Pus or a bad smell.  You are dizzy or you feel like you might faint.  You have a severe headache, especially when you sit or stand. Summary  A lumbar puncture is a procedure in which a small needle is inserted into the lower back to remove fluid that surrounds the brain and spinal cord.  After this procedure, it is common to have a headache and pain around the needle insertion area.  Lying flat, staying hydrated, and drinking caffeine can help prevent headaches.  Monitor your needle insertion site for signs of infection, including warmth, fluid, or more pain.  Get help right away if you develop leg weakness, leg numbness, incontinence, or severe headaches. This information is not intended to replace advice given to you by your health care provider. Make sure you discuss any questions you have with your health care provider. Document Revised: 12/22/2016 Document Reviewed: 12/22/2016 Elsevier Patient Education  2020 [employer]  

## 2020-03-24 NOTE — Progress Notes (Signed)
PT verbalizes discharge understanding and discharge instructions given. PT in NAD, VS.

## 2020-03-25 ENCOUNTER — Ambulatory Visit: Payer: BC Managed Care – PPO

## 2020-03-25 ENCOUNTER — Ambulatory Visit: Admission: RE | Admit: 2020-03-25 | Payer: BC Managed Care – PPO | Source: Ambulatory Visit

## 2020-03-25 LAB — IGG 1, 2, 3, AND 4
IgG (Immunoglobin G), Serum: 878 mg/dL (ref 603–1613)
IgG, Subclass 1: 522 mg/dL (ref 248–810)
IgG, Subclass 2: 209 mg/dL (ref 130–555)
IgG, Subclass 3: 50 mg/dL (ref 15–102)
IgG, Subclass 4: 47 mg/dL (ref 2–96)

## 2020-03-25 LAB — IGG CSF INDEX
Albumin CSF-mCnc: 27 mg/dL (ref 11–48)
Albumin: 4.5 g/dL (ref 4.0–5.0)
CSF IgG Index: 0.5 (ref 0.0–0.7)
IgG (Immunoglobin G), Serum: 913 mg/dL (ref 603–1613)
IgG, CSF: 2.9 mg/dL (ref 0.0–8.6)
IgG/Alb Ratio, CSF: 0.11 (ref 0.00–0.25)

## 2020-03-26 LAB — OLIGOCLONAL BANDS, CSF + SERM

## 2020-03-26 LAB — ANGIOTENSIN CONVERTING ENZYME, CSF: Angio Convert Enzyme: <1.5 U/L (ref 0.0–2.5)

## 2020-03-27 ENCOUNTER — Other Ambulatory Visit: Payer: Self-pay | Admitting: Neurology

## 2020-03-27 DIAGNOSIS — R42 Dizziness and giddiness: Secondary | ICD-10-CM

## 2020-03-28 ENCOUNTER — Other Ambulatory Visit: Payer: Self-pay | Admitting: Neurology

## 2020-03-28 DIAGNOSIS — D692 Other nonthrombocytopenic purpura: Secondary | ICD-10-CM

## 2020-03-28 DIAGNOSIS — R42 Dizziness and giddiness: Secondary | ICD-10-CM

## 2020-04-03 ENCOUNTER — Emergency Department: Payer: BC Managed Care – PPO

## 2020-04-03 ENCOUNTER — Other Ambulatory Visit: Payer: Self-pay

## 2020-04-03 DIAGNOSIS — R519 Headache, unspecified: Secondary | ICD-10-CM | POA: Insufficient documentation

## 2020-04-03 DIAGNOSIS — Z5321 Procedure and treatment not carried out due to patient leaving prior to being seen by health care provider: Secondary | ICD-10-CM | POA: Diagnosis not present

## 2020-04-03 NOTE — ED Triage Notes (Signed)
Pt had a lumbar puncture done 2 weeks ago and the pressure eased away but has returned much worse. Pt is following Dr. Sherryll Burger about the intercranial pressure.

## 2020-04-04 ENCOUNTER — Emergency Department
Admission: EM | Admit: 2020-04-04 | Discharge: 2020-04-04 | Disposition: A | Discharge status: Home / Self Care | Payer: BC Managed Care – PPO | Attending: Emergency Medicine | Admitting: Emergency Medicine

## 2020-04-04 ENCOUNTER — Telehealth: Payer: Self-pay | Admitting: Emergency Medicine

## 2020-04-04 NOTE — Telephone Encounter (Signed)
Called patient due to lwot to inquire about condition and follow up plans. Left message.   

## 2020-04-04 NOTE — ED Notes (Signed)
No answer when called several times from lobby 

## 2020-04-08 ENCOUNTER — Ambulatory Visit: Payer: BC Managed Care – PPO

## 2020-04-10 ENCOUNTER — Other Ambulatory Visit: Payer: Self-pay | Admitting: Neurology

## 2020-04-10 DIAGNOSIS — G932 Benign intracranial hypertension: Secondary | ICD-10-CM

## 2020-04-15 ENCOUNTER — Ambulatory Visit
Admission: RE | Admit: 2020-04-15 | Discharge: 2020-04-15 | Disposition: A | Discharge status: Home / Self Care | Payer: BC Managed Care – PPO | Source: Ambulatory Visit | Attending: Neurology | Admitting: Neurology

## 2020-04-15 ENCOUNTER — Other Ambulatory Visit: Payer: Self-pay

## 2020-04-15 DIAGNOSIS — G932 Benign intracranial hypertension: Secondary | ICD-10-CM

## 2020-04-15 LAB — CBC
HCT: 42.1 % (ref 39.0–52.0)
Hemoglobin: 14.7 g/dL (ref 13.0–17.0)
MCH: 30.4 pg (ref 26.0–34.0)
MCHC: 34.9 g/dL (ref 30.0–36.0)
MCV: 87 fL (ref 80.0–100.0)
Platelets: 180 10*3/uL (ref 150–400)
RBC: 4.84 MIL/uL (ref 4.22–5.81)
RDW: 13.2 % (ref 11.5–15.5)
WBC: 5.3 10*3/uL (ref 4.0–10.5)
nRBC: 0 % (ref 0.0–0.2)

## 2020-04-15 LAB — APTT: aPTT: 38 seconds — ABNORMAL HIGH (ref 24–36)

## 2020-04-15 LAB — PROTIME-INR
INR: 1 (ref 0.8–1.2)
Prothrombin Time: 13.2 seconds (ref 11.4–15.2)

## 2020-04-15 NOTE — Progress Notes (Signed)
PT given discharge instructions regarding LP instructions and after care. Pt verbalizes understanding and denies any further questions or concerns. VS, pt in NAD at time of departure. Per Dr. Register pt is stable to discharge at this time

## 2020-04-15 NOTE — Progress Notes (Signed)
Returned to [facility] via stretcher.  Side rails up, call light in reach.  Post LP site assessed, bandaid in place without drainage or redness.  Pain 1/10 at LP site, "just pressure".  HOB flat.  Ice water at bedside.

## 2020-04-15 NOTE — Discharge Instructions (Signed)
Lumbar Puncture °A lumbar puncture, also called a spinal tap, is a procedure that is done to remove a small amount of the fluid that surrounds the brain and spinal cord (cerebrospinal fluid, CSF). The fluid is then examined in the lab. This procedure may be done to: °· Help diagnose various problems, such as meningitis, encephalitis, multiple sclerosis, and other infections. °· Remove fluid and relieve pressure that occurs with certain types of headaches. °· Look for bleeding within the brain and spinal cord areas (central nervous system). °· Place medicine into the spinal fluid. °Tell a health care provider about: °· Any allergies you have. °· All medicines you are taking, including vitamins, herbs, eye drops, creams, and over-the-counter medicines like aspirin or NSAIDs. °· Any problems you or family members have had with anesthetic medicines. °· Any blood disorders you have. °· Any surgeries you have had. °· Any medical conditions you have. °· Whether you are pregnant or may be pregnant. °What are the risks? °Generally, this is a safe procedure. However, problems may occur, including: °· Infection at the insertion site that can spread to the bone or spinal fluid. °· Bleeding. °· Spinal headache. This is a severe headache that occurs when there is a leak of spinal fluid. °· Leakage of CSF. °· Allergic reactions to medicines or dyes. °· Damage to other structures or organs. °What happens before the procedure? °Staying hydrated °Follow instructions from your health care provider about hydration, which may include: °· Up to 2 hours before the procedure - you may continue to drink clear liquids, such as water, clear fruit juice, black coffee, and plain tea. °Eating and drinking restrictions °Follow instructions from your health care provider about eating and drinking. If you will be given a medicine to help you relax (sedative), these instructions may include: °· 8 hours before the procedure - stop eating heavy meals  or foods such as meat, fried foods, or fatty foods. °· 6 hours before the procedure - stop eating light meals or foods, such as toast or cereal. °· 6 hours before the procedure - stop drinking milk or drinks that contain milk. °· 2 hours before the procedure - stop drinking clear liquids. °Medicines °· Ask your health care provider about: °? Changing or stopping your regular medicines. This is especially important if you are taking diabetes medicines or blood thinners. °? Taking medicines such as aspirin and ibuprofen. These medicines can thin your blood. Do not take these medicines before your procedure if your health care provider instructs you not to. °· You may be given antibiotic medicine to help prevent infection. If so, take the antibiotic as told by your health care provider. °General instructions °· You may have a blood sample taken. °· You may be asked to shower with a germ-killing soap. °· Ask your health care provider how your surgical site will be marked or identified. °· Plan to have someone take you home from the hospital or clinic. This is especially important if you will be given a sedative. °· If you will be going home right after the procedure, plan to have someone with you for 24 hours. °What happens during the procedure? ° °· You may lie down on your side with your knees bent, or you may sit with your head resting on a pillow on your lap. °? How you are positioned depends on your age and size. °? You will be positioned so that the spaces between the bones of the spine (vertebrae) are as wide   as possible. This will make it easier to pass the needle into the spinal canal. °· The skin on your lower back (lumbar region) will be cleaned. °· You will be given an injection of medicine to numb your lower back area (local anesthetic). °· You may be given pain medicine or a sedative. °· A small needle will be inserted into your lower back until it enters the space that contains the cerebrospinal fluid.  The needle will not enter the spinal cord. °· Fluid will be collected into tubes. It will be sent to a lab for examination. °· The needle will be withdrawn, and a bandage (dressing) will be placed over the area. °The procedure may vary among health care providers and hospitals. °What happens after the procedure? °· Your blood pressure, heart rate, breathing rate, and blood oxygen level will be monitored until any medicines you were given have worn off. °· You may need to stay lying down for a while. °· You will need to drink plenty of fluids and caffeine to help prevent a headache. °· Do not drive for 24 hours if you received a sedative. °Summary °· A lumbar puncture, also called a spinal tap, is a procedure that is done to remove a small amount of the cerebrospinal fluid, CSF. This may be done to help diagnose a wide variety of conditions. °· Before the procedure, tell your health care provider about all medicines you are taking, including vitamins, herbs, eye drops, creams, and over-the-counter medicines. °· Before the procedure, ask your health care provider about changing or stopping your regular medicines. This is especially important if you are taking blood thinners. °· Your lower back will be numbed with an injection before the needle is placed into your spinal canal. °· After the procedure, you will lie down for a while and you will drink plenty of fluids. °This information is not intended to replace advice given to you by your health care provider. Make sure you discuss any questions you have with your health care provider. °Document Revised: 12/22/2016 Document Reviewed: 12/22/2016 °Elsevier Patient Education © 2020 [employer] ° °

## 2020-04-15 NOTE — Progress Notes (Signed)
Pt stable after lp.Back stable.D/c instructions given.F/u with his MD.

## 2020-06-13 ENCOUNTER — Ambulatory Visit: Payer: BC Managed Care – PPO | Attending: Neurology

## 2020-06-13 ENCOUNTER — Other Ambulatory Visit: Payer: Self-pay

## 2020-06-13 DIAGNOSIS — R42 Dizziness and giddiness: Secondary | ICD-10-CM

## 2020-06-13 NOTE — Therapy (Signed)
[facility] MAIN [facility] SERVICES [address] [place], [place], [address] Phone: [phone]   Fax:  [phone]  Physical Therapy Evaluation  Patient Details  Name: Alan Chapman. MRN: [id number] Date of Birth: 10-31-1971 Referring Provider (PT): Dr. Sherryll Burger   Encounter Date: 06/13/2020   PT End of Session - 06/13/20 1026    Visit Number 1    Number of Visits 9    Date for PT Re-Evaluation 08/08/20    Authorization Type eval: 06/13/20    PT Start Time 0847    PT Stop Time 0945    PT Time Calculation (min) 58 min    Equipment Utilized During Treatment Gait belt    Activity Tolerance Patient tolerated treatment well    Behavior During Therapy WFL for tasks assessed/performed           Past Medical History:  Diagnosis Date   Hypertension     Past Surgical History:  Procedure Laterality Date   CARPAL TUNNEL RELEASE     right hand    There were no vitals filed for this visit.    Subjective Assessment - 06/13/20 0847    Subjective Dizziness/unsteadiness    Pertinent History Patient states his symptoms started 02/04/20. He was standing and got lightheaded and head was feeling fuzzy. Patient states dizziness happens daily, sometimes multiples times a day and last several seconds to 30 minutes. Symptoms were improving until approximately 3 weeks ago but then had a sudden return of symptoms. Patient does have pressure, pain, and congestion in both ears. Dizziness occurs in any posture or position. Patient states dizziness is provoked by walking down aisle of a supermarket, vacuuming, concentrating, and standing or sitting up. Symptoms sometime occur spontaneously without any position changes or head movements. Dizziness is accompanied by lightheadedness, general unsteadiness, pressure in head, and sometime stumbling. A few times he states it feels like the room is spinning. Sitting down or holding head sometimes help ease symptoms. Patient  denies any associated nausea of vomiting. Patient denies any numbness, tingling, or weakness. Patient denies family history of similar problems. Patient reports valsalva induced pressure in head and pulsatile tinnitus, per patient he has a history of tinnitus in both ears. Pt has seen neurology and was referred to neurotology in [place] where he  had a vertigo test and a hearing test," both of which came back normal. Based on description it sounds like pt had BPPV testing and an audiogram. He returns next Tuesday for a VNG study and will try to get results before he returns for his next follow-up session. Denies history of headaches but neurologist is considering treating patient for migraines. No visual changes. Denies N/T. Denies slurred speech. No known history of COVID. He has been vaccinated. MRI normal. Pt is wearing a scopolamine patch today upon arrival.    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 No/denies   Unrelated to current episode            VESTIBULAR AND BALANCE EVALUATION   HISTORY:  Patient states his symptoms started 02/04/20. He was standing and got lightheaded and head was feeling fuzzy. Patient states dizziness happens daily, sometimes multiples times a day and last several seconds to 30 minutes. Symptoms were improving until approximately 3 weeks ago but then had a sudden return of symptoms. Patient does have pressure, pain, and congestion in both ears. Dizziness occurs in  any posture or position. Patient states dizziness is provoked by walking down aisle of a supermarket, vacuuming, concentrating, and standing or sitting up. Symptoms sometime occur spontaneously without any position changes or head movements. Dizziness is accompanied by lightheadedness, general unsteadiness, pressure in head, and sometime stumbling. A few times he states it feels like the room is spinning. Sitting down or  holding head sometimes help ease symptoms. Patient denies any associated nausea of vomiting. Patient denies any numbness, tingling, or weakness. Patient denies family history of similar problems. Patient reports valsalva induced pressure in head and pulsatile tinnitus, per patient he has a history of tinnitus in both ears. Pt has seen neurology and was referred to neurotology in [place] where he  had a vertigo test and a hearing test," both of which came back normal. Based on description it sounds like pt had BPPV testing and an audiogram. He returns next Tuesday for a VNG study and will try to get results before he returns for his next follow-up session. Denies history of headaches but neurologist is considering treating patient for migraines. No visual changes. Denies N/T. Denies slurred speech. No known history of COVID. He has been vaccinated. MRI normal. Pt is wearing a scopolamine patch today upon arrival.  Frequency: Daily, sometimes multiple times a day Duration: 30 secs to 30 minutes, varies each episode Symptom nature: Variable, intermittent  Provocative Factors: Can't pin down anything that triggers it; walking down aisles in the grocery store, standing up gets lightheaded, house-hold chores like vacuuming going back and forth, and difficulty concentrating, occasionally spontaneous Easing Factors: Sitting down, holding head  Progression of symptoms: Symptoms were improving until approximately 3 weeks ago but then had a sudden return of symptoms. 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 Reports chronic aural fullness. Denies any hearing loss. Pressure builds up,  Vision (diplopia, visual field loss, recent changes, last eye  exam): no visual changes during episodes. Eye exam normal.  Red Flags: (dysarthria, dysphagia, drop attacks, bowel and bladder changes, recent weight loss/gain) Review of systems negative for red flags. No changes in speech, no history of cancer, no bowel bladder changes,     EXAMINATION  POSTURE:   NEUROLOGICAL SCREEN: N=normal  Ab=abnormal, Reflexes not tested.  Level Dermatome R L Myotome R L  C3 Anterior Neck N N Sidebend C2-3 N N  C4 Top of Shoulder N N Shoulder Shrug C4 N N  C5 Lateral Upper Arm N N Shoulder ABD C4-5 N N  C6 Lateral Arm/ Thumb N N Arm Flex/ Wrist Ext C5-6 N N  C7 Middle Finger N N Arm Ext//Wrist Flex C6-7 N N  C8 4th & 5th Finger N N Flex/ Ext Carpi Ulnaris C8 N N  T1 Medial Arm N N Interossei T1 N N  L2 Medial thigh/groin N N Illiopsoas (L2-3) N N  L3 Lower thigh/med.knee N N Quadriceps (L3-4) N N  L4 Medial leg/lat thigh N N Tibialis Ant (L4-5) N N  L5 Lat. leg & dorsal foot N N EHL (L5) N N  S1 post/lat foot/thigh/leg N N Gastrocnemius (S1-2) N N  S2 Post./med. thigh & leg N N Hamstrings (L4-S3) N N    Cranial Nerves Visual acuity and visual fields are intact  Extraocular  muscles are intact  Facial sensation is intact bilaterally  Facial strength is intact bilaterally  Hearing is normal as tested by gross conversation Palate elevates midline, normal phonation  Shoulder shrug strength is intact  Tongue protrudes midline     COORDINATION: Finger to Nose: Normal Heel to Shin: Normal Pronator Drift: Negative on L, Mildly positive on R Rapid Alternating Movements: Normal Finger to Thumb Opposition: Normal  MUSCULOSKELETAL SCREEN: Cervical Spine ROM: WFL and painless in all planes. No gross deficits identified   ROM: Grossly WFL  MMT: Grossly WFL  Functional Mobility: Independent for transfers and ambulation without assistive device   Gait: Scanning of visual environment with gait is: normal.   POSTURAL CONTROL TESTS:   Clinical Test of  Sensory Interaction for Balance (CTSIB): deferred to next session   OCULOMOTOR / VESTIBULAR TESTING:  Oculomotor Exam- Room Light  Findings Comments  Ocular Alignment normal   Ocular ROM normal   Spontaneous Nystagmus normal   Gaze-Holding Nystagmus normal   End-Gaze Nystagmus normal   Vergence (normal 2-3") normal   Smooth Pursuit normal   Cross-Cover Test normal   Saccades normal   VOR Cancellation normal   Left Head Impulse normal   Right Head Impulse abnormal Corrective saccade  Static Acuity not examined   Dynamic Acuity not examined     Oculomotor Exam- Fixation Suppressed - Deferred to next visit   BPPV TESTS:  Symptoms Duration Intensity Nystagmus  L Dix-Hallpike None   None  R Dix-Hallpike None   None  L Head Roll None   None  R Head Roll None   None    Testing for Orthostatic Hypotension: Negative Supine: BP: 127/79; O2: 97%; HR: 61  Sitting: BP: 132/85 ; O2: 96%; HR: 71  Standing: BP: 130/78; O2: 97%; HR: 72  FUNCTIONAL OUTCOME MEASURES   Results Comments  FGA 29/30   ABC Scale 74.375%   DHI 28/100   FOTO 81 Predictive improvement is 88.      Objective measurements completed on examination: See above findings.       Plan - 06/13/20 1207    Clinical Impression Statement Pt is a pleasant 49 year-old male referred for dizziness. He experiences episodes daily with duration varying with each one. Unable to pin point position and posture of triggers, as dizziness is provoked with many different activities and positions. PT examination reveals possible positive R head impulse test. Pt is having VNG study next week. Patient feels dizzy upon sitting up and intermittently during the FGA. Patient scored a 29/30 on FGA, 74.375% on ABC, 28/100 on DHI, and 81 on FOTO. Coordination was all normal except a mildly positive pronator drift on R. BPPV testing and orthostatic vitals were all negative. Pt denies seeing cardiology and does report an episode of severe  dizziness recently after running for 30 minutes on the treadmill at the gym. If it has not been performed pt might benefit from referral to cardiology for consideration of stress test vs halter monitor. Pt also reporting frequent onset of symptoms spontaneously at rest without position/head changes which is concerning for non vestibular pathology. Pt presents with no deficits in strength, gait or balance however is reporting subjective dizziness. HEP will be given to patient at next visit. Pt will benefit from skilled PT services to address deficits in dizziness.    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   Stability/Clinical Decision Making Stable/Uncomplicated    Clinical Decision Making Low    Rehab Potential Fair    PT Frequency 1x / week    PT Duration 8 weeks    PT Treatment/Interventions ADLs/Self Care Home Management;Electrical Stimulation;Aquatic Therapy;Canalith Repostioning;Stair training;Functional mobility training;Therapeutic activities;Therapeutic exercise;Balance training;Neuromuscular re-education;Patient/family education;Manual techniques;Dry needling;Vestibular;Spinal Manipulations;Joint Manipulations;Gait training    PT Next Visit Plan Create HEP, progress with adaptation/habituation exercises    PT Home Exercise Plan No HEP given. Will address next visit.    Consulted and Agree with Plan of Care Patient           Patient will benefit from skilled therapeutic intervention in order to improve the following deficits and impairments:  Dizziness  Visit Diagnosis: Dizziness and giddiness - Plan: PT plan of care cert/re-cert     Problem List There are no problems to display for this patient.  This entire session was performed  under direct supervision and direction of a licensed therapist/therapist assistant . I have personally read, edited and approve of the note as written.   Katherine BassetKimmie Jerrell Hart, SPT Huprich,Jason 06/13/2020, 12:45 PM  [facility] [address] [place], [place], [address] Phone: [phone]   Fax:  [phone]  Name: Alan LeschRandall C Reitter Jr. MRN: [id number] Date of Birth: 07/06/1971

## 2020-06-20 ENCOUNTER — Other Ambulatory Visit: Payer: Self-pay

## 2020-06-20 ENCOUNTER — Ambulatory Visit: Payer: BC Managed Care – PPO

## 2020-06-20 DIAGNOSIS — R42 Dizziness and giddiness: Secondary | ICD-10-CM

## 2020-06-20 NOTE — Therapy (Signed)
[facility] MAIN [facility] SERVICES [address] [place], [place], [address] Phone: [phone]   Fax:  [phone]  Physical Therapy Treatment  Patient Details  Name: Alan Chapman. MRN: [id number] Date of Birth: 20-Dec-1970 Referring Provider (PT): Dr. Sherryll Burger   Encounter Date: 06/20/2020   PT End of Session - 06/20/20 1014    Visit Number 2    Number of Visits 9    Date for PT Re-Evaluation 08/08/20    Authorization Type eval: 06/13/20    PT Start Time 0847    PT Stop Time 0930    PT Time Calculation (min) 43 min    Equipment Utilized During Treatment Gait belt    Activity Tolerance Patient tolerated treatment well    Behavior During Therapy WFL for tasks assessed/performed           Past Medical History:  Diagnosis Date  . Hypertension     Past Surgical History:  Procedure Laterality Date  . CARPAL TUNNEL RELEASE     right hand    There were no vitals filed for this visit.   Subjective Assessment - 06/20/20 0850    Subjective Patient was on the way home from last session and head started hurting, he had to rest the rest of the day. Patient is okay sitting still, but once he starts moving the head hurts. He had the VNG study done and won't receive the results until next Friday at 2 pm. From what they told him, the L side seems to be weaker than the R.    Pertinent History Patient states his symptoms started 02/04/20. He was standing and got lightheaded and head was feeling fuzzy. Patient states dizziness happens daily, sometimes multiples times a day and last several seconds to 30 minutes. Symptoms were improving until approximately 3 weeks ago but then had a sudden return of symptoms. Patient does have pressure, pain, and congestion in both ears. Dizziness occurs in any posture or position. Patient states dizziness is provoked by walking down aisle of a supermarket, vacuuming, concentrating, and standing or sitting up. Symptoms sometime  occur spontaneously without any position changes or head movements. Dizziness is accompanied by lightheadedness, general unsteadiness, pressure in head, and sometime stumbling. A few times he states it feels like the room is spinning. Sitting down or holding head sometimes help ease symptoms. Patient denies any associated nausea of vomiting. Patient denies any numbness, tingling, or weakness. Patient denies family history of similar problems. Patient reports valsalva induced pressure in head and pulsatile tinnitus, per patient he has a history of tinnitus in both ears. Pt has seen neurology and was referred to neurotology in [place] where he  had a "vertigo test" and a "hearing test," both of which came back normal. Based on description it sounds like pt had BPPV testing and an audiogram. He returns next Tuesday for a VNG study and will try to get results before he returns for his next follow-up session. Denies history of headaches but neurologist is considering treating patient for migraines. No visual changes. Denies N/T. Denies slurred speech. No known history of COVID. He has been vaccinated. MRI normal. Pt is wearing a scopolamine patch today upon arrival.    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 No/denies              TREATMENT  Neuromuscular Re-Education Warm-up on Nustep L2 x 5  minutes during history taking (unbilled 2 minutes); VOR x 1 Horizontal in sitting 3 x 60 secs, first time: 30 secs before head starts to feel woozy and lightheaded, second and third times: 60 secs, woozy feeling but not as bad; VOR x 1 Vertical in sitting x 60s- no reports of dizziness; Tandem walks on airex pad x 2 lengths; 2 lengths side step walks, easy for patient; Tandem stance on airex pad with horizontal L/R head turns x 30 seconds alternating lead foot; Tandem stance on airex pad with horizontal L/R head and body turns x  30 seconds alternating lead foot; Walking in hallway with horizontal ball tosses 4 x 75 feet; Walking in hallway with ball bounces 2 x 75 feet; Walking in hallway with vertical tosses 2 x 75 feet, cued to bring chin all the way down; Walking in hallway backwards with horizontal ball tosses 2 x 75 feet, cued to look all the way to the L, patient more wobbly walking backwards with first few steps; Bending over to pick up balls and transfering it to the another box on other side of body forcing pt to perform 180 degree turns x 2.   Pt educated throughout session about proper posture and technique with exercises. Improved exercise technique, movement at target joints, use of target muscles after min to mod verbal, visual, tactile cues.   Pt demonstrates excellent motivation during session today. Session focused on aggravating symptoms to habituate the movement or motion. VOR x 1 with horizontal head turns brought on the woozy and lightheadedness feeling, but got better with each attempt. Patient reports no symptoms with gait in the hallway, but was more wobbly when walking backwards with the first few steps. Patient notes that he avoids bending down due to immediate onset of symptoms, so practiced bending down in a controlled environment. Pt encouraged to continue HEP and follow-up as scheduled. He will benefit from PT services to address deficits in balance and mobility in order to return to full function at home.               PT Short Term Goals - 06/13/20 1029      PT SHORT TERM GOAL #1   Title Pt will be independent with HEP in order to improve strength and balance in order to decrease fall risk and improve function at home and work.    Time 4    Period Weeks    Status New    Target Date 07/11/20             PT Long Term Goals - 06/13/20 1205      PT LONG TERM GOAL #1   Title Pt will improve ABC by at least 13% in order to demonstrate clinically significant improvement in  balance confidence.    Baseline 06/13/20: 74.375%    Time 8    Period Weeks    Status New    Target Date 08/08/20      PT LONG TERM GOAL #2   Title Pt will decrease DHI score by at least 18 points in order to demonstrate clinically significant reduction in disability.    Baseline 06/13/20: 28/100    Time 8    Period Weeks    Status New    Target Date 08/08/20      PT LONG TERM GOAL #3   Title Pt will increase FOTO score to 88 in order to demonstrate clinically significant reduction in disability.    Baseline 06/13/20: 81  Time 8    Period Weeks    Status New    Target Date 08/08/20                 Plan - 06/20/20 1014    Clinical Impression Statement Pt demonstrates excellent motivation during session today. Session focused on aggravating symptoms to habituate the movement or motion. VOR x 1 with horizontal head turns brought on the woozy and lightheadedness feeling, but got better with each attempt. Patient reports no symptoms with gait in the hallway, but was more wobbly when walking backwards with the first few steps. Patient notes that he avoids bending down due to immediate onset of symptoms, so practiced bending down in a controlled environment. Pt encouraged to continue HEP and follow-up as scheduled. He will benefit from PT services to address deficits in balance and mobility in order to return to full function at home.    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    Rehab Potential Fair    PT Frequency 1x / week    PT Duration 8 weeks    PT Treatment/Interventions ADLs/Self Care Home Management;Electrical Stimulation;Aquatic Therapy;Canalith Repostioning;Stair  training;Functional mobility training;Therapeutic activities;Therapeutic exercise;Balance training;Neuromuscular re-education;Patient/family education;Manual techniques;Dry needling;Vestibular;Spinal Manipulations;Joint Manipulations;Gait training    PT Next Visit Plan progress with adaptation/habituation exercises    PT Home Exercise Plan Access Code: [email or web address]    Consulted and Agree with Plan of Care Patient           Patient will benefit from skilled therapeutic intervention in order to improve the following deficits and impairments:  Dizziness  Visit Diagnosis: Dizziness and giddiness     Problem List There are no problems to display for this patient.   This entire session was performed under direct supervision and direction of a licensed therapist/therapist assistant . I have personally read, edited and approve of the note as written.   Katherine Basset, SPT Lynnea Maizes PT, DPT, GCS  Huprich,Jason 06/20/2020, 10:39 AM  [facility] MAIN [facility] SERVICES [address] [place], [place], [address] Phone: [phone]   Fax:  [phone]  Name: Alan Chapman. MRN: [id number] Date of Birth: 04/02/1971

## 2020-06-27 ENCOUNTER — Ambulatory Visit: Payer: BC Managed Care – PPO | Attending: Neurology

## 2020-06-27 ENCOUNTER — Other Ambulatory Visit: Payer: Self-pay

## 2020-06-27 DIAGNOSIS — R42 Dizziness and giddiness: Secondary | ICD-10-CM | POA: Insufficient documentation

## 2020-06-27 NOTE — Therapy (Signed)
[place] [facility] MAIN [facility] SERVICES [address] [place], [place], [address] Phone: [phone]   Fax:  [phone]  Physical Therapy Treatment  Patient Details  Name: Alan Chapman. MRN: [id number] Date of Birth: [birth year] Referring Provider (PT): Dr. Sherryll Burger   Encounter Date: 06/27/2020   PT End of Session - 06/27/20 0840    Visit Number 3    Number of Visits 9    Date for PT Re-Evaluation 08/08/20    Authorization Type eval: 06/13/20    PT Start Time 0845    PT Stop Time 0930    PT Time Calculation (min) 45 min    Equipment Utilized During Treatment Gait belt    Activity Tolerance Patient tolerated treatment well    Behavior During Therapy WFL for tasks assessed/performed           Past Medical History:  Diagnosis Date  . Hypertension     Past Surgical History:  Procedure Laterality Date  . CARPAL TUNNEL RELEASE     right hand    There were no vitals filed for this visit.   Subjective Assessment - 06/27/20 0840    Subjective After last session, his migraines were very bad the whole weekend and lasted until Tuesday. He was given sumatriptan for his migraines and has taken 2 in the last 2 days, which he notes has helped. Since he was resting last week, he hasn't felt much dizziness. He was unable to do exercises over the weekend due to the headaches, but was better toward the end of the week.    Pertinent History Patient states his symptoms started 02/04/20. He was standing and got lightheaded and head was feeling fuzzy. Patient states dizziness happens daily, sometimes multiples times a day and last several seconds to 30 minutes. Symptoms were improving until approximately 3 weeks ago but then had a sudden return of symptoms. Patient does have pressure, pain, and congestion in both ears. Dizziness occurs in any posture or position. Patient states dizziness is provoked by walking down aisle of a supermarket, vacuuming, concentrating, and  standing or sitting up. Symptoms sometime occur spontaneously without any position changes or head movements. Dizziness is accompanied by lightheadedness, general unsteadiness, pressure in head, and sometime stumbling. A few times he states it feels like the room is spinning. Sitting down or holding head sometimes help ease symptoms. Patient denies any associated nausea of vomiting. Patient denies any numbness, tingling, or weakness. Patient denies family history of similar problems. Patient reports valsalva induced pressure in head and pulsatile tinnitus, per patient he has a history of tinnitus in both ears. Pt has seen neurology and was referred to neurotology in [place] where he  had a "vertigo test" and a "hearing test," both of which came back normal. Based on description it sounds like pt had BPPV testing and an audiogram. He returns next Tuesday for a VNG study and will try to get results before he returns for his next follow-up session. Denies history of headaches but neurologist is considering treating patient for migraines. No visual changes. Denies N/T. Denies slurred speech. No known history of COVID. He has been vaccinated. MRI normal. Pt is wearing a scopolamine patch today upon arrival.    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 No/denies              TREATMENT    Neuromuscular Re-Education Warm-up on  Nustep L2 x 5 minutes during history taking (unbilled 2 minutes); VOR x 1 Horizontal in sitting 1 x 60s, no symptoms, 1x 40s, no symptoms but stopped because of neck pain; Tandem stance on airex pad with horizontal L/R head turns x 30 seconds alternating lead foot; Tandem stance on airex pad with horizontal L/R head and body turns with ball passes x 30 seconds alternating lead foot; Walking in hallway forward and backwards with horizontal ball tosses 2 x 75' each, cued to look all the way to the L,  patient more wobbly walking backwards with first few steps; Walking in hallway forward and backwards with ball bounces 2 x 75' each; Walking in hallway with vertical tosses 2 x 75', cued to bring chin all the way down; Walking in hallway with grabbing ball over R shoulder and giving it to therapist over L shoulder, head/eye follow with therapist adjusting height between waist and overhead 2 x 75'; Airex balance beam tandem walks x 2 lengths, patient did not lose balance and did not provoke symtpoms.  Bending over to pick up balls and transferring it to the another box on other side of body forcing pt to perform 180 degree turns x 2, did not bring on symptoms.  Bosu ball balancing with turning over R shoulder to pick up cones and handing it over L shoulder to SPT x multiple bouts, patient loss balance 2 times, but unable to provoke symptoms.  Red mat forward and backward walks with horizontal ball turns x 5 lengths, did not lose balance and unable to provoke symptoms;   Pt educated throughout session about proper posture and technique with exercises. Improved exercise technique, movement at target joints, use of target muscles after min to mod verbal, visual, tactile cues.   Pt demonstrates excellent motivation during session today. Session focused on aggravating symptoms to habituate the movement or motion. Not able to aggravate or bring on symptoms during the session. He was given a new migraine medication that he just started a couple of days ago, which has helped. Patient will receive VNG study results this afternoon, which will inform us on how to direct treatment next session. Pt encouraged to continue HEP and follow-up as scheduled. He will benefit from PT services to address deficits in balance and mobility in order to return to full function at home.                 PT Short Term Goals - 06/13/20 1029      PT SHORT TERM GOAL #1   Title Pt will be independent with HEP in order  to improve strength and balance in order to decrease fall risk and improve function at home and work.    Time 4    Period Weeks    Status New    Target Date 07/11/20             PT Long Term Goals - 06/13/20 1205      PT LONG TERM GOAL #1   Title Pt will improve ABC by at least 13% in order to demonstrate clinically significant improvement in balance confidence.    Baseline 06/13/20: 74.375%    Time 8    Period Weeks    Status New    Target Date 08/08/20      PT LONG TERM GOAL #2   Title Pt will decrease DHI score by at least 18 points in order to demonstrate clinically significant reduction in disability.    Baseline 06/13/20:  28/100    Time 8    Period Weeks    Status New    Target Date 08/08/20      PT LONG TERM GOAL #3   Title Pt will increase FOTO score to 88 in order to demonstrate clinically significant reduction in disability.    Baseline 06/13/20: 81    Time 8    Period Weeks    Status New    Target Date 08/08/20                 Plan - 06/27/20 1030    Clinical Impression Statement Pt demonstrates excellent motivation during session today. Session focused on aggravating symptoms to habituate the movement or motion. Not able to aggravate or bring on symptoms during the session. He was given a new migraine medication that he just started a couple of days ago, which has helped. Patient will receive VNG study results this afternoon, which will inform us on how to direct treatment next session. Pt encouraged to continue HEP and follow-up as scheduled. He will benefit from PT services to address deficits in balance and mobility in order to return to full function at home.    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    Rehab Potential Fair    PT Frequency 1x / week    PT Duration 8 weeks    PT Treatment/Interventions ADLs/Self Care Home Management;Electrical Stimulation;Aquatic Therapy;Canalith Repostioning;Stair training;Functional mobility training;Therapeutic activities;Therapeutic exercise;Balance training;Neuromuscular re-education;Patient/family education;Manual techniques;Dry needling;Vestibular;Spinal Manipulations;Joint Manipulations;Gait training    PT Next Visit Plan progress with adaptation/habituation exercises    PT Home Exercise Plan Access Code: [email or web address]    Consulted and Agree with Plan of Care Patient           Patient will benefit from skilled therapeutic intervention in order to improve the following deficits and impairments:  Dizziness  Visit Diagnosis: Dizziness and giddiness     Problem List There are no problems to display for this patient.   This entire session was performed under direct supervision and direction of a licensed therapist/therapist assistant . I have personally read, edited and approve of the note as written.   Katherine Basset, SPT Lynnea Maizes PT, DPT, GCS  Huprich,Jason 06/27/2020, 10:35 AM  [facility] MAIN [facility] SERVICES [address] [place], [place], [address] Phone: [phone]   Fax:  [phone]  Name: Alan Chapman. MRN: [id number] Date of Birth: 04/01/1971

## 2020-07-04 ENCOUNTER — Other Ambulatory Visit: Payer: Self-pay

## 2020-07-04 ENCOUNTER — Ambulatory Visit: Payer: BC Managed Care – PPO

## 2020-07-04 DIAGNOSIS — R42 Dizziness and giddiness: Secondary | ICD-10-CM | POA: Diagnosis not present

## 2020-07-04 NOTE — Therapy (Signed)
[facility] MAIN [facility] SERVICES [address] [place], [place], [address] Phone: [phone]   Fax:  [phone]  Physical Therapy Treatment  Patient Details  Name: Alan Chapman. MRN: [id number] Date of Birth: [birth year] Referring Provider (PT): Dr. Sherryll Burger   Encounter Date: 07/04/2020   PT End of Session - 07/04/20 0842    Visit Number 4    Number of Visits 9    Date for PT Re-Evaluation 08/08/20    Authorization Type eval: 06/13/20    PT Start Time 0845    PT Stop Time 0930    PT Time Calculation (min) 45 min    Equipment Utilized During Treatment Gait belt    Activity Tolerance Patient tolerated treatment well    Behavior During Therapy WFL for tasks assessed/performed           Past Medical History:  Diagnosis Date  . Hypertension     Past Surgical History:  Procedure Laterality Date  . CARPAL TUNNEL RELEASE     right hand    There were no vitals filed for this visit.   Subjective Assessment - 07/04/20 0842    Subjective Patient had a roller coaster of a week. He was able to do activities this week but thinks he might have overdone it with yard work and going to [employer]. He has taken all 4 migraine pills for the week as his headaches have gotten worse towards the end of the week. He reports no dizziness this week just the migraines. He as an appointment on the 8/25 with Dr. Sherryll Burger and wants to go back to work in September.    Pertinent History Patient states his symptoms started 02/04/20. He was standing and got lightheaded and head was feeling fuzzy. Patient states dizziness happens daily, sometimes multiples times a day and last several seconds to 30 minutes. Symptoms were improving until approximately 3 weeks ago but then had a sudden return of symptoms. Patient does have pressure, pain, and congestion in both ears. Dizziness occurs in any posture or position. Patient states dizziness is provoked by walking down aisle of a  supermarket, vacuuming, concentrating, and standing or sitting up. Symptoms sometime occur spontaneously without any position changes or head movements. Dizziness is accompanied by lightheadedness, general unsteadiness, pressure in head, and sometime stumbling. A few times he states it feels like the room is spinning. Sitting down or holding head sometimes help ease symptoms. Patient denies any associated nausea of vomiting. Patient denies any numbness, tingling, or weakness. Patient denies family history of similar problems. Patient reports valsalva induced pressure in head and pulsatile tinnitus, per patient he has a history of tinnitus in both ears. Pt has seen neurology and was referred to neurotology in [place] where he  had a "vertigo test" and a "hearing test," both of which came back normal. Based on description it sounds like pt had BPPV testing and an audiogram. He returns next Tuesday for a VNG study and will try to get results before he returns for his next follow-up session. Denies history of headaches but neurologist is considering treating patient for migraines. No visual changes. Denies N/T. Denies slurred speech. No known history of COVID. He has been vaccinated. MRI normal. Pt is wearing a scopolamine patch today upon arrival.    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 No/denies  TREATMENT     Neuromuscular Re-Education Warm-up on Nustep L2 x 6 minutes during history taking (unbilled 1 minute); VOR x 1 in standing 1 x 60s, no symptoms, progressed to standing with a busy background 1 x 60 secs, no symptoms VOR x 1 with gait forwards/backwards 2 x 50', no symtpoms.  Walking in hallway forward and backwards with horizontal ball tosses 1 x 75' each, Walking in hallway forward and backwards with ball bounces 1 x 75' each; Walking in hallway forward and backwards with vertical tosses 1 x 75'  each; Walking in hallway forward and backwards with grabbing ball over R shoulder and giving it to therapist over L shoulder, head/eye follow 1 x 75' each; Walking in hallway forward and backwards with grabbing ball over L shoulder and giving it to therapist over R shoulder, head/eye follow 1 x 75' each;  Bending over to pick up balls of various weights and transfering it to another box on other side of body forcing pt to perform 180 degree turns x 2 standing on airex pad, did not bring on symptoms.   Lifting boxes of various weights onto shelves and back to the floor x multiple bouts; Oxygen roller with 30# and rolling it over the parallel bar ramp into the closet and transfering 10# weights from the oxygen roller to the shelf and back x 2 rounds.    Pt educated throughout session about proper posture and technique with exercises. Improved exercise technique, movement at target joints, use of target muscles after min to mod verbal, visual, tactile cues.    Pt demonstrates excellent motivation during session today. Session focused on aggravating symptoms to habituate the movement or motion and exercises to help simulate the work environment. Not able to aggravate or bring on symptoms during the session. Continued to make exercises harder with increased weight and added unstable surfaces. Progressed HEP to VOR x 1 standing and walking forwards/backwards. Pt encouraged to continue HEP and follow-up as scheduled. He will benefit from PT services to address deficits in balance and mobility in order to return to full function at home.         PT Short Term Goals - 06/13/20 1029      PT SHORT TERM GOAL #1   Title Pt will be independent with HEP in order to improve strength and balance in order to decrease fall risk and improve function at home and work.    Time 4    Period Weeks    Status New    Target Date 07/11/20             PT Long Term Goals - 06/13/20 1205      PT LONG TERM GOAL #1     Title Pt will improve ABC by at least 13% in order to demonstrate clinically significant improvement in balance confidence.    Baseline 06/13/20: 74.375%    Time 8    Period Weeks    Status New    Target Date 08/08/20      PT LONG TERM GOAL #2   Title Pt will decrease DHI score by at least 18 points in order to demonstrate clinically significant reduction in disability.    Baseline 06/13/20: 28/100    Time 8    Period Weeks    Status New    Target Date 08/08/20      PT LONG TERM GOAL #3   Title Pt will increase FOTO score to 88 in order to demonstrate clinically significant  reduction in disability.    Baseline 06/13/20: 81    Time 8    Period Weeks    Status New    Target Date 08/08/20                 Plan - 07/04/20 0842    Clinical Impression Statement Pt demonstrates excellent motivation during session today. Session focused on aggravating symptoms to habituate the movement or motion and exercises to help simulate the work environment. Not able to aggravate or bring on symptoms during the session. Continued to make exercises harder with increased weight and added unstable surfaces. Progressed HEP to VOR x 1 standing and walking forwards/backwards. Pt encouraged to continue HEP and follow-up as scheduled. He will benefit from PT services to address deficits in balance and mobility in order to return to full function at home.    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    Rehab Potential Fair    PT Frequency 1x / week    PT Duration 8 weeks    PT Treatment/Interventions ADLs/Self Care Home Management;Electrical Stimulation;Aquatic Therapy;Canalith Repostioning;Stair  training;Functional mobility training;Therapeutic activities;Therapeutic exercise;Balance training;Neuromuscular re-education;Patient/family education;Manual techniques;Dry needling;Vestibular;Spinal Manipulations;Joint Manipulations;Gait training    PT Next Visit Plan progress with adaptation/habituation exercises    PT Home Exercise Plan Access Code: [email or web address]    Consulted and Agree with Plan of Care Patient           Patient will benefit from skilled therapeutic intervention in order to improve the following deficits and impairments:  Dizziness  Visit Diagnosis: Dizziness and giddiness     Problem List There are no problems to display for this patient.   This entire session was performed under direct supervision and direction of a licensed therapist/therapist assistant . I have personally read, edited and approve of the note as written.   Katherine Basset, SPT Lynnea Maizes PT, DPT, GCS  Huprich,Jason 07/04/2020, 12:47 PM  [facility] MAIN [facility] SERVICES [address] [place], [place], [address] Phone: [phone]   Fax:  [phone]  Name: Alan Chapman. MRN: [id number] Date of Birth: 23-Nov-1970

## 2020-07-11 ENCOUNTER — Ambulatory Visit: Payer: BC Managed Care – PPO

## 2020-07-18 ENCOUNTER — Ambulatory Visit: Payer: BC Managed Care – PPO

## 2020-07-23 ENCOUNTER — Ambulatory Visit: Payer: BC Managed Care – PPO

## 2020-07-25 ENCOUNTER — Ambulatory Visit: Payer: BC Managed Care – PPO

## 2021-01-01 ENCOUNTER — Other Ambulatory Visit: Payer: Self-pay | Admitting: Surgery

## 2021-01-09 ENCOUNTER — Other Ambulatory Visit
Admission: RE | Admit: 2021-01-09 | Discharge: 2021-01-09 | Disposition: A | Discharge status: Home / Self Care | Payer: BC Managed Care – PPO | Source: Ambulatory Visit | Attending: Surgery | Admitting: Surgery

## 2021-01-09 ENCOUNTER — Other Ambulatory Visit: Payer: Self-pay

## 2021-01-09 HISTORY — DX: Headache, unspecified: R51.9

## 2021-01-09 HISTORY — DX: Unspecified osteoarthritis, unspecified site: M19.90

## 2021-01-09 HISTORY — DX: Gastro-esophageal reflux disease without esophagitis: K21.9

## 2021-01-09 NOTE — Patient Instructions (Signed)
Your procedure is scheduled on: January 14, 2021 WED Report to the Registration Desk on the 1st floor of the [facility] To find out your arrival time, please call [phone] between 1PM - 3PM on: January 13, 2021 TUESDAY  REMEMBER: Instructions that are not followed completely may result in serious medical risk, up to and including death; or upon the discretion of your surgeon and anesthesiologist your surgery may need to be rescheduled.  Do not eat food after midnight the night before surgery.  No gum chewing, lozengers or hard candies.  You may however, drink CLEAR liquids up to 2 hours before you are scheduled to arrive for your surgery. Do not drink anything within 2 hours of your scheduled arrival time.  Clear liquids include: - water  - apple juice without pulp - gatorade (not RED, PURPLE, OR BLUE) - black coffee or tea (Do NOT add milk or creamers to the coffee or tea) Do NOT drink anything that is not on this list.  Type 1 and Type 2 diabetics should only drink water.  In addition, your doctor has ordered for you to drink the provided  Ensure Pre-Surgery Clear Carbohydrate Drink  Drinking this carbohydrate drink up to two hours before surgery helps to reduce insulin resistance and improve patient outcomes. Please complete drinking 2 hours prior to scheduled arrival time.  TAKE THESE MEDICATIONS THE MORNING OF SURGERY WITH A SIP OF WATER: FAMOTIDINE (take one the night before and one on the morning of surgery - helps to prevent nausea after surgery.)   DO NOT TAKE LOSARTAN THE DAY OF SURGERY  One week prior to surgery: Stop Anti-inflammatories (NSAIDS) such as Advil, Aleve, Ibuprofen, Motrin, Naproxen, Naprosyn and ASPIRIN OR Aspirin based products such as Excedrin, Goodys Powder, BC Powder. Stop ANY OVER THE COUNTER supplements until after surgery. (However, you may continue taking Vitamin D, Vitamin B, and multivitamin up until the day before surgery.)  No  Alcohol for 24 hours before or after surgery.  No Smoking including e-cigarettes for 24 hours prior to surgery.  No chewable tobacco products for at least 6 hours prior to surgery.  No nicotine patches on the day of surgery.  Do not use any "recreational" drugs for at least a week prior to your surgery.  Please be advised that the combination of cocaine and anesthesia may have negative outcomes, up to and including death. If you test positive for cocaine, your surgery will be cancelled.  On the morning of surgery brush your teeth with toothpaste and water, you may rinse your mouth with mouthwash if you wish. Do not swallow any toothpaste or mouthwash.  Do not wear jewelry, make-up, hairpins, clips or nail polish.  Do not wear lotions, powders, or perfumes OR DEODORANT  Do not shave body from the neck down 48 hours prior to surgery just in case you cut yourself which could leave a site for infection.  Also, freshly shaved skin may become irritated if using the CHG soap.  Contact lenses, hearing aids and dentures may not be worn into surgery.  Do not bring valuables to the hospital. [facility] is not responsible for any missing/lost belongings or valuables.   Use CHG Soap  as directed on instruction sheet.  Bring your C-PAP to the hospital with you in case you may have to spend the night.   Notify your doctor if there is any change in your medical condition (cold, fever, infection).  Wear comfortable clothing (specific to your surgery type)  to the hospital.  Plan for stool softeners for home use; pain medications have a tendency to cause constipation. You can also help prevent constipation by eating foods high in fiber such as fruits and vegetables and drinking plenty of fluids as your diet allows.  After surgery, you can help prevent lung complications by doing breathing exercises.  Take deep breaths and cough every 1-2 hours. Your doctor may order a device called an Incentive  Spirometer to help you take deep breaths. When coughing or sneezing, hold a pillow firmly against your incision with both hands. This is called "splinting." Doing this helps protect your incision. It also decreases belly discomfort.  If you are being discharged the day of surgery, you will not be allowed to drive home. You will need a responsible adult (18 years or older) to drive you home and stay with you that night.   Please call the Pre-admissions Testing Dept. at [phone] if you have any questions about these instructions.  Visitation Policy:  Patients undergoing a surgery or procedure may have one family member or support person with them as long as that person is not COVID-19 positive or experiencing its symptoms.  That person may remain in the waiting area during the procedure.  Inpatient Visitation:    Visiting hours are 7 a.m. to 8 p.m. Patients will be allowed one visitor. The visitor may change daily. The visitor must pass COVID-19 screenings, use hand sanitizer when entering and exiting the patient's room and wear a mask at all times, including in the patient's room. Patients must also wear a mask when staff or their visitor are in the room. Masking is required regardless of vaccination status. Systemwide, no visitors 17 or younger.

## 2021-01-12 ENCOUNTER — Encounter
Admission: RE | Admit: 2021-01-12 | Discharge: 2021-01-12 | Disposition: A | Discharge status: Home / Self Care | Payer: BC Managed Care – PPO | Source: Ambulatory Visit | Attending: Surgery | Admitting: Surgery

## 2021-01-12 ENCOUNTER — Other Ambulatory Visit: Payer: Self-pay

## 2021-01-12 DIAGNOSIS — I10 Essential (primary) hypertension: Secondary | ICD-10-CM | POA: Insufficient documentation

## 2021-01-12 DIAGNOSIS — Z20822 Contact with and (suspected) exposure to covid-19: Secondary | ICD-10-CM | POA: Insufficient documentation

## 2021-01-12 DIAGNOSIS — Z01818 Encounter for other preprocedural examination: Secondary | ICD-10-CM | POA: Insufficient documentation

## 2021-01-12 DIAGNOSIS — Z791 Long term (current) use of non-steroidal anti-inflammatories (NSAID): Secondary | ICD-10-CM | POA: Diagnosis not present

## 2021-01-12 DIAGNOSIS — Z823 Family history of stroke: Secondary | ICD-10-CM | POA: Diagnosis not present

## 2021-01-12 DIAGNOSIS — Z888 Allergy status to other drugs, medicaments and biological substances status: Secondary | ICD-10-CM | POA: Diagnosis not present

## 2021-01-12 DIAGNOSIS — Z79899 Other long term (current) drug therapy: Secondary | ICD-10-CM | POA: Diagnosis not present

## 2021-01-12 DIAGNOSIS — G5602 Carpal tunnel syndrome, left upper limb: Secondary | ICD-10-CM | POA: Diagnosis present

## 2021-01-12 DIAGNOSIS — Z8249 Family history of ischemic heart disease and other diseases of the circulatory system: Secondary | ICD-10-CM | POA: Diagnosis not present

## 2021-01-12 LAB — SARS CORONAVIRUS 2 (TAT 6-24 HRS): SARS Coronavirus 2: NEGATIVE

## 2021-01-14 ENCOUNTER — Encounter
Admission: RE | Disposition: A | Discharge status: Home / Self Care | Payer: Self-pay | Source: Home / Self Care | Attending: Surgery

## 2021-01-14 ENCOUNTER — Ambulatory Visit: Payer: BC Managed Care – PPO | Admitting: Certified Registered"

## 2021-01-14 ENCOUNTER — Other Ambulatory Visit: Payer: Self-pay

## 2021-01-14 ENCOUNTER — Ambulatory Visit
Admission: RE | Admit: 2021-01-14 | Discharge: 2021-01-14 | Disposition: A | Discharge status: Home / Self Care | Payer: BC Managed Care – PPO | Attending: Surgery | Admitting: Surgery

## 2021-01-14 ENCOUNTER — Encounter: Payer: Self-pay | Admitting: Surgery

## 2021-01-14 DIAGNOSIS — G5602 Carpal tunnel syndrome, left upper limb: Secondary | ICD-10-CM | POA: Insufficient documentation

## 2021-01-14 DIAGNOSIS — Z79899 Other long term (current) drug therapy: Secondary | ICD-10-CM | POA: Insufficient documentation

## 2021-01-14 DIAGNOSIS — Z791 Long term (current) use of non-steroidal anti-inflammatories (NSAID): Secondary | ICD-10-CM | POA: Insufficient documentation

## 2021-01-14 DIAGNOSIS — Z823 Family history of stroke: Secondary | ICD-10-CM | POA: Insufficient documentation

## 2021-01-14 DIAGNOSIS — Z20822 Contact with and (suspected) exposure to covid-19: Secondary | ICD-10-CM | POA: Insufficient documentation

## 2021-01-14 DIAGNOSIS — Z8249 Family history of ischemic heart disease and other diseases of the circulatory system: Secondary | ICD-10-CM | POA: Insufficient documentation

## 2021-01-14 DIAGNOSIS — Z888 Allergy status to other drugs, medicaments and biological substances status: Secondary | ICD-10-CM | POA: Insufficient documentation

## 2021-01-14 HISTORY — PX: CARPAL TUNNEL RELEASE: SHX101

## 2021-01-14 SURGERY — RELEASE, CARPAL TUNNEL, ENDOSCOPIC
Anesthesia: General | Site: Wrist | Laterality: Left

## 2021-01-14 MED ORDER — CEFAZOLIN SODIUM-DEXTROSE 2-4 GM/100ML-% IV SOLN
INTRAVENOUS | Status: AC
  Filled 2021-01-14: qty 100

## 2021-01-14 MED ORDER — DEXAMETHASONE SODIUM PHOSPHATE 10 MG/ML IJ SOLN
INTRAMUSCULAR | Status: AC
  Filled 2021-01-14: qty 1

## 2021-01-14 MED ORDER — CHLORHEXIDINE GLUCONATE 0.12 % MT SOLN
15.0000 mL | Freq: Once | OROMUCOSAL | Status: AC
  Administered 2021-01-14: 15 mL via OROMUCOSAL

## 2021-01-14 MED ORDER — DEXAMETHASONE SODIUM PHOSPHATE 10 MG/ML IJ SOLN
INTRAMUSCULAR | Status: DC | PRN
  Administered 2021-01-14: 10 mg via INTRAVENOUS

## 2021-01-14 MED ORDER — CEFAZOLIN SODIUM-DEXTROSE 2-4 GM/100ML-% IV SOLN
2.0000 g | INTRAVENOUS | Status: AC
  Administered 2021-01-14: 2 g via INTRAVENOUS

## 2021-01-14 MED ORDER — ORAL CARE MOUTH RINSE: 15.0000 mL | Freq: Once | OROMUCOSAL | Status: AC

## 2021-01-14 MED ORDER — ONDANSETRON HCL 4 MG/2ML IJ SOLN: 4.0000 mg | Freq: Four times a day (QID) | INTRAMUSCULAR | Status: DC | PRN

## 2021-01-14 MED ORDER — ONDANSETRON HCL 4 MG/2ML IJ SOLN
INTRAMUSCULAR | Status: AC
  Filled 2021-01-14: qty 2

## 2021-01-14 MED ORDER — MIDAZOLAM HCL 2 MG/2ML IJ SOLN
INTRAMUSCULAR | Status: AC
  Filled 2021-01-14: qty 2

## 2021-01-14 MED ORDER — BUPIVACAINE HCL (PF) 0.5 % IJ SOLN
INTRAMUSCULAR | Status: AC
  Filled 2021-01-14: qty 30

## 2021-01-14 MED ORDER — GLYCOPYRROLATE 0.2 MG/ML IJ SOLN
INTRAMUSCULAR | Status: AC
  Filled 2021-01-14: qty 1

## 2021-01-14 MED ORDER — BUPIVACAINE HCL (PF) 0.5 % IJ SOLN
INTRAMUSCULAR | Status: DC | PRN
  Administered 2021-01-14: 10 mL

## 2021-01-14 MED ORDER — ONDANSETRON HCL 4 MG/2ML IJ SOLN
INTRAMUSCULAR | Status: DC | PRN
  Administered 2021-01-14: 4 mg via INTRAVENOUS

## 2021-01-14 MED ORDER — LIDOCAINE HCL (PF) 2 % IJ SOLN
INTRAMUSCULAR | Status: DC | PRN
  Administered 2021-01-14: 50 mg

## 2021-01-14 MED ORDER — METOCLOPRAMIDE HCL 5 MG/ML IJ SOLN
5.0000 mg | Freq: Three times a day (TID) | INTRAMUSCULAR | Status: DC | PRN
Start: 2021-01-14 — End: 2021-01-14

## 2021-01-14 MED ORDER — PROPOFOL 10 MG/ML IV BOLUS
INTRAVENOUS | Status: DC | PRN
  Administered 2021-01-14: 40 mg via INTRAVENOUS
  Administered 2021-01-14: 160 mg via INTRAVENOUS

## 2021-01-14 MED ORDER — ONDANSETRON HCL 4 MG PO TABS: 4.0000 mg | ORAL_TABLET | Freq: Four times a day (QID) | ORAL | Status: DC | PRN

## 2021-01-14 MED ORDER — LACTATED RINGERS IV SOLN: INTRAVENOUS | Status: DC

## 2021-01-14 MED ORDER — POTASSIUM CHLORIDE IN NACL 20-0.9 MEQ/L-% IV SOLN: INTRAVENOUS | Status: DC

## 2021-01-14 MED ORDER — ONDANSETRON HCL 4 MG/2ML IJ SOLN: 4.0000 mg | Freq: Once | INTRAMUSCULAR | Status: DC | PRN

## 2021-01-14 MED ORDER — FENTANYL CITRATE (PF) 100 MCG/2ML IJ SOLN
25.0000 ug | INTRAMUSCULAR | Status: DC | PRN
Start: 2021-01-14 — End: 2021-01-14

## 2021-01-14 MED ORDER — FENTANYL CITRATE (PF) 100 MCG/2ML IJ SOLN
INTRAMUSCULAR | Status: DC | PRN
  Administered 2021-01-14 (×2): 50 ug via INTRAVENOUS

## 2021-01-14 MED ORDER — CHLORHEXIDINE GLUCONATE 0.12 % MT SOLN
OROMUCOSAL | Status: AC
  Filled 2021-01-14: qty 15

## 2021-01-14 MED ORDER — FENTANYL CITRATE (PF) 100 MCG/2ML IJ SOLN
INTRAMUSCULAR | Status: AC
  Filled 2021-01-14: qty 2

## 2021-01-14 MED ORDER — LIDOCAINE HCL (PF) 2 % IJ SOLN
INTRAMUSCULAR | Status: AC
  Filled 2021-01-14: qty 5

## 2021-01-14 MED ORDER — METOCLOPRAMIDE HCL 10 MG PO TABS: 5.0000 mg | ORAL_TABLET | Freq: Three times a day (TID) | ORAL | Status: DC | PRN

## 2021-01-14 MED ORDER — GLYCOPYRROLATE 0.2 MG/ML IJ SOLN
INTRAMUSCULAR | Status: DC | PRN
  Administered 2021-01-14: .2 mg via INTRAVENOUS

## 2021-01-14 MED ORDER — MIDAZOLAM HCL 5 MG/5ML IJ SOLN
INTRAMUSCULAR | Status: DC | PRN
  Administered 2021-01-14: 2 mg via INTRAVENOUS

## 2021-01-14 SURGICAL SUPPLY — 33 items
APL PRP STRL LF DISP 70% ISPRP (MISCELLANEOUS) ×1
BNDG COHESIVE 4X5 TAN STRL (GAUZE/BANDAGES/DRESSINGS) ×2 IMPLANT
BNDG ELASTIC 2X5.8 VLCR STR LF (GAUZE/BANDAGES/DRESSINGS) ×2 IMPLANT
BNDG ESMARK 4X12 TAN STRL LF (GAUZE/BANDAGES/DRESSINGS) ×2 IMPLANT
CANISTER SUCT 1200ML W/VALVE (MISCELLANEOUS) IMPLANT
CHLORAPREP W/TINT 26 (MISCELLANEOUS) ×2 IMPLANT
CORD BIP STRL DISP 12FT (MISCELLANEOUS) ×2 IMPLANT
COVER WAND RF STERILE (DRAPES) ×2 IMPLANT
CUFF TOURN SGL QUICK 18X4 (TOURNIQUET CUFF) ×2 IMPLANT
DRAPE SURG 17X11 SM STRL (DRAPES) ×2 IMPLANT
FORCEPS JEWEL BIP 4-3/4 STR (INSTRUMENTS) ×2 IMPLANT
GAUZE SPONGE 4X4 12PLY STRL (GAUZE/BANDAGES/DRESSINGS) ×2 IMPLANT
GAUZE XEROFORM 1X8 LF (GAUZE/BANDAGES/DRESSINGS) ×2 IMPLANT
GLOVE INDICATOR 8.0 STRL GRN (GLOVE) ×2 IMPLANT
GLOVE SURG ENC MOIS LTX SZ8 (GLOVE) ×2 IMPLANT
GOWN STRL REUS W/ TWL LRG LVL3 (GOWN DISPOSABLE) ×3 IMPLANT
GOWN STRL REUS W/ TWL XL LVL3 (GOWN DISPOSABLE) ×1 IMPLANT
GOWN STRL REUS W/TWL LRG LVL3 (GOWN DISPOSABLE) ×6
GOWN STRL REUS W/TWL XL LVL3 (GOWN DISPOSABLE) ×2
KIT CARPAL TUNNEL (MISCELLANEOUS) ×2
KIT ESCP INSRT D SLOT CANN KN (MISCELLANEOUS) ×1 IMPLANT
KIT TURNOVER KIT A (KITS) ×2 IMPLANT
MANIFOLD NEPTUNE II (INSTRUMENTS) ×2 IMPLANT
NS IRRIG 500ML POUR BTL (IV SOLUTION) ×2 IMPLANT
PACK EXTREMITY ARMC (MISCELLANEOUS) ×2 IMPLANT
SPLINT WRIST LG LT TX990309 (SOFTGOODS) IMPLANT
SPLINT WRIST LG RT TX900304 (SOFTGOODS) IMPLANT
SPLINT WRIST M LT TX990308 (SOFTGOODS) IMPLANT
SPLINT WRIST M RT TX990303 (SOFTGOODS) IMPLANT
SPLINT WRIST XL LT TX990310 (SOFTGOODS) ×2 IMPLANT
SPLINT WRIST XL RT TX990305 (SOFTGOODS) IMPLANT
STOCKINETTE IMPERVIOUS 9X36 MD (GAUZE/BANDAGES/DRESSINGS) ×2 IMPLANT
SUT PROLENE 4 0 PS 2 18 (SUTURE) ×2 IMPLANT

## 2021-01-14 NOTE — Anesthesia Postprocedure Evaluation (Signed)
Anesthesia Post Note  Patient: Alan Chapman.  Procedure(s) Performed: CARPAL TUNNEL RELEASE ENDOSCOPIC (Left Wrist)  Patient location during evaluation: PACU Anesthesia Type: General Level of consciousness: awake and alert Pain management: pain level controlled Vital Signs Assessment: post-procedure vital signs reviewed and stable Respiratory status: spontaneous breathing and respiratory function stable Cardiovascular status: stable Anesthetic complications: no   No complications documented.   Last Vitals:  Vitals:   01/14/21 0856 01/14/21 1134  BP: (!) 152/99   Pulse: (!) 59   Resp: 16   Temp: 36.7 C (!) 36.2 C  SpO2: 98%     Last Pain:  Vitals:   01/14/21 0856  TempSrc: Oral  PainSc: 0-No pain                 Brodey Bonn K

## 2021-01-14 NOTE — Discharge Instructions (Addendum)
Orthopedic discharge instructions: Keep dressing dry and intact. Keep hand elevated above heart level. May shower after dressing removed on postop day 4 (Sunday). Cover sutures with Band-Aids after drying off. Apply ice to affected area frequently. Take Aleve 2 tabs BID OR ibuprofen 600-800 mg TID with meals for 7-10 days, then as necessary. Take ES Tylenol or pain medication as prescribed when needed.  Return for follow-up in 10-14 days or as scheduled.    AMBULATORY SURGERY  DISCHARGE INSTRUCTIONS   1) The drugs that you were given will stay in your system until tomorrow so for the next 24 hours you should not:  A) Drive an automobile B) Make any legal decisions C) Drink any alcoholic beverage   2) You may resume regular meals tomorrow.  Today it is better to start with liquids and gradually work up to solid foods.  You may eat anything you prefer, but it is better to start with liquids, then soup and crackers, and gradually work up to solid foods.   3) Please notify your doctor immediately if you have any unusual bleeding, trouble breathing, redness and pain at the surgery site, drainage, fever, or pain not relieved by medication.    4) Additional Instructions:        Please contact your physician with any problems or Same Day Surgery at [phone], Monday through Friday 6 am to 4 pm, or [place] at [facility] number at [phone].

## 2021-01-14 NOTE — H&P (Signed)
History of Present Illness: Alan Chapman. is a 50 y.o. male who presents today for his surgical history and physical for upcoming left endoscopic carpal tunnel release procedure. Surgery scheduled with Dr. Joice Lofts on 01/14/2021. The patient denies any changes in his medical history since he was last evaluated. He denies any falls or trauma affecting the left hand since his last evaluation. Denies any personal history of heart attack, stroke, asthma or COPD. The patient is left-hand dominant. No personal history of DVT.  Past Medical History: . Arthritis  . Carpal tunnel syndrome, left 11/26/2020  . GERD (gastroesophageal reflux disease)  . Hypertension  . Sleep apnea   Past Surgical History: . CARPAL TUNNEL RELEASE  . ENDOSCOPIC CARPAL TUNNEL RELEASE  . Rottator Cuff Surgery Right  . TONSILLECTOMY   Past Family History: . Stroke Father  . High blood pressure (Hypertension) Father  . No Known Problems Brother  . No Known Problems Sister  . No Known Problems Brother  . Cancer Neg Hx  . Diabetes type II Neg Hx  . Alcohol abuse Neg Hx  . Depression Neg Hx   Medications: . diclofenac (VOLTAREN) 75 MG EC tablet TAKE 1 TABLET BY MOUTH TWICE DAILY WITH MEALS 60 tablet 0  . famotidine (PEPCID) 40 MG tablet Take 1 tablet (40 mg total) by mouth nightly 90 tablet 3  . gabapentin (NEURONTIN) 100 MG capsule Take 1 capsule (100 mg total) by mouth nightly for 60 doses Increase to 2 tablets at night time if no improvement. 60 capsule 1  . loratadine (CLARITIN) 10 mg tablet Take 10 mg by mouth once daily.  Marland Kitchen losartan (COZAAR) 100 MG tablet Take 1 tablet (100 mg total) by mouth once daily 90 tablet 3  . naproxen sodium (ALEVE, ANAPROX) 220 MG tablet Take 220 mg by mouth 2 (two) times daily with meals.  . SUMAtriptan (IMITREX) 100 MG tablet Take 100 mg by mouth as directed for Migraine May take a second dose after 2 hours if needed.  Marland Kitchen acetaZOLAMIDE (DIAMOX) 250 MG tablet Take 1,000 mg by mouth 2  (two) times daily (Patient not taking: Reported on 01/12/2021 )  . omega-3 fatty acids/fish oil (FISH OIL OMEGA 3-6-9 ORAL) Take by mouth (Patient not taking: Reported on 01/12/2021 )   Allergies: . Etodolac - Dizziness   Review of Systems:  A comprehensive 14 point ROS was performed, reviewed by me today, and the pertinent orthopaedic findings are documented in the HPI.  Physical Exam: BP (!) 148/78  Ht 170.2 cm (5\' 7" )  Wt (!) 111.5 kg (245 lb 12.8 oz)  BMI 38.50 kg/m  General/Constitutional: The patient appears to be well-nourished, well-developed, and in no acute distress. Neuro/Psych: Normal mood and affect, oriented to person, place and time. Eyes: Non-icteric. Pupils are equal, round, and reactive to light, and exhibit synchronous movement. ENT: Unremarkable. Lymphatic: No palpable adenopathy. Respiratory: Lungs clear to auscultation, Normal chest excursion, No wheezes and Non-labored breathing Cardiovascular: Regular rate and rhythm. No murmurs. and No edema, swelling or tenderness, except as noted in detailed exam. Integumentary: No impressive skin lesions present, except as noted in detailed exam. Musculoskeletal: Unremarkable, except as noted in detailed exam.  Left wrist/hand exam: Skin inspection of the left wrist and hand is unremarkable. No swelling, erythema, ecchymosis, abrasions, or other skin abnormalities are identified. He has no tenderness to palpation over the dorsal or palmar aspects of the wrist or hand. He exhibits near full active and passive range of motion of his wrist  without any pain or catching. He is able actively flex and extend all digits without any pain or triggering. He is neurovascularly intact to his left hand. He has a negative Phalen's test and a negative Tinel's test over the carpal tunnel.  EMG results:  A recent EMG of the left upper extremity is available for review and has been reviewed by myself. By report, this study demonstrates evidence  of "severe (grade 4) carpal tunnel syndrome" of the left wrist. This report has been reviewed by myself and discussed with the patient and his fiance.  Impression: Carpal tunnel syndrome, left.  Plan:  1. Treatment options were discussed today with the patient. 2. The patient is scheduled for a left endoscopic carpal tunnel release procedure with Dr. Joice Lofts on 01/14/2021. 3. The patient was instructed on the risk and benefits of surgery and would like to proceed at this time. 4. This document will serve as a surgical history and physical for the patient. 5. The patient will follow-up per standard postop protocol. They can call the clinic they have any questions, new symptoms develop or symptoms worsen.  The procedure was discussed with the patient, as were the potential risks (including bleeding, infection, nerve and/or blood vessel injury, persistent or recurrent pain, failure of the repair, progression of arthritis, need for further surgery, blood clots, strokes, heart attacks and/or arhythmias, pneumonia, etc.) and benefits. The patient states his understanding and wishes to proceed.   H&P reviewed and patient re-examined. No changes.

## 2021-01-14 NOTE — Anesthesia Preprocedure Evaluation (Addendum)
Anesthesia Evaluation  Patient identified by MRN, date of birth, ID band Patient awake    Reviewed: Allergy & Precautions, NPO status , Patient's Chart, lab work & pertinent test results  History of Anesthesia Complications Negative for: history of anesthetic complications  Airway Mallampati: III       Dental   Pulmonary neg sleep apnea, neg COPD, Not current smoker,           Cardiovascular hypertension, Pt. on medications (-) Past MI and (-) CHF (-) dysrhythmias (-) Valvular Problems/Murmurs     Neuro/Psych neg Seizures    GI/Hepatic Neg liver ROS, GERD  Medicated and Controlled,  Endo/Other  neg diabetes  Renal/GU negative Renal ROS     Musculoskeletal   Abdominal (+) + obese,   Peds  Hematology   Anesthesia Other Findings   Reproductive/Obstetrics                            Anesthesia Physical Anesthesia Plan  ASA: II  Anesthesia Plan: General   Post-op Pain Management:    Induction: Intravenous  PONV Risk Score and Plan: 2 and Ondansetron and Dexamethasone  Airway Management Planned: LMA  Additional Equipment:   Intra-op Plan:   Post-operative Plan:   Informed Consent: I have reviewed the patients History and Physical, chart, labs and discussed the procedure including the risks, benefits and alternatives for the proposed anesthesia with the patient or authorized representative who has indicated his/her understanding and acceptance.       Plan Discussed with:   Anesthesia Plan Comments:         Anesthesia Quick Evaluation

## 2021-01-14 NOTE — Anesthesia Procedure Notes (Signed)
Procedure Name: LMA Insertion Performed by: Junell Cullifer, CRNA Pre-anesthesia Checklist: Patient identified, Patient being monitored, Timeout performed, Emergency Drugs available and Suction available Patient Re-evaluated:Patient Re-evaluated prior to induction Oxygen Delivery Method: Circle system utilized Preoxygenation: Pre-oxygenation with 100% oxygen Induction Type: IV induction Ventilation: Mask ventilation without difficulty LMA: LMA inserted LMA Size: 4.5 Tube type: Oral Number of attempts: 1 Placement Confirmation: positive ETCO2 and breath sounds checked- equal and bilateral Tube secured with: Tape Dental Injury: Teeth and Oropharynx as per pre-operative assessment        

## 2021-01-14 NOTE — Op Note (Signed)
01/14/2021  11:33 AM  Patient:   Alan Chapman.  Pre-Op Diagnosis:   Left carpal tunnel syndrome.  Post-Op Diagnosis:   Same.  Procedure:   Endoscopic left carpal tunnel release.  Surgeon:   Maryagnes Amos, MD  Anesthesia:   General LMA  Findings:   As above.  Complications:   None  EBL:   0 cc  Fluids:   500 cc crystalloid  TT:   18 minutes at 250 mmHg  Drains:   None  Closure:   4-0 Prolene interrupted sutures  Brief Clinical Note:   The patient is a 50 year old male with a long history of progressively worsening pain and paresthesias to his left hand. His symptoms have progressed despite medications, activity modification, etc. His history and examination are consistent with carpal tunnel syndrome, confirmed by EMG. The patient presents at this time for an endoscopic left carpal tunnel release.   Procedure:   The patient was brought into the operating room and lain in the supine position. After adequate general laryngeal mask anesthesia was obtained, the left hand and upper extremity were prepped with ChloraPrep solution before being draped sterilely. Preoperative antibiotics were administered. A timeout was performed to verify the appropriate surgical site before the limb was exsanguinated with an Esmarch and the tourniquet inflated to 250 mmHg. An approximately 1.5-2 cm incision was made over the volar wrist flexion crease, centered over the palmaris longus tendon. The incision was carried down through the subcutaneous tissues with care taken to identify and protect any neurovascular structures. The distal forearm fascia was penetrated just proximal to the transverse carpal ligament. The soft tissues were released off the superficial and deep surfaces of the distal forearm fascia and this was released proximally for 3-4 cm under direct visualization.  Attention was directed distally. The Therapist, nutritional was passed beneath the transverse carpal ligament along the ulnar  aspect of the carpal tunnel and used to release any adhesions as well as to remove any adherent synovial tissue before first the smaller then the larger of the two dilators were passed beneath the transverse carpal ligament along the ulnar margin of the carpal tunnel. The slotted cannula was introduced and the endoscope was placed into the slotted cannula and the undersurface of the transverse carpal ligament visualized. The distal margin of the transverse carpal ligament was marked by placing a 25-gauge needle percutaneously at Kaplan's cardinal point so that it entered the distal portion of the slotted cannula. Under endoscopic visualization, the transverse carpal ligament was released from proximal to distal using the end-cutting blade. A second pass was performed to ensure complete release of the ligament. The adequacy of release was verified both endoscopically and by palpation using the freer elevator.  The wound was irrigated thoroughly with sterile saline solution before being closed using 4-0 Prolene interrupted sutures. A total of 10 cc of 0.5% plain Sensorcaine was injected in and around the incision before a sterile bulky dressing was applied to the wound. The patient was placed into a volar wrist splint before being awakened, extubated, and returned to the recovery room in satisfactory condition after tolerating the procedure well.

## 2021-01-14 NOTE — Transfer of Care (Signed)
Immediate Anesthesia Transfer of Care Note  Patient: Alan Chapman.  Procedure(s) Performed: CARPAL TUNNEL RELEASE ENDOSCOPIC (Left Wrist)  Patient Location: PACU  Anesthesia Type:General  Level of Consciousness: drowsy  Airway & Oxygen Therapy: Patient Spontanous Breathing and Patient connected to face mask oxygen  Post-op Assessment: Report given to RN and Post -op Vital signs reviewed and stable  Post vital signs: Reviewed and stable  Last Vitals:  Vitals Value Taken Time  BP 128/84 01/14/21 1134  Temp    Pulse 53 01/14/21 1136  Resp 15 01/14/21 1136  SpO2 100 % 01/14/21 1136  Vitals shown include unvalidated device data.  Last Pain:  Vitals:   01/14/21 0856  TempSrc: Oral  PainSc: 0-No pain         Complications: No complications documented.

## 2021-11-20 IMAGING — CT CT HEAD W/O CM
3 series · 15 of 47 positions shown, 18 images · non-contrast
Comparison: None.

CLINICAL DATA: Vertigo.

EXAM:
CT HEAD WITHOUT CONTRAST
TECHNIQUE: Contiguous axial images were obtained from the base of the skull
through the vertex without intravenous contrast.

[Series 2: head wo · axial · 0.47mm/px · z∈[-156,-26]mm · 9 of 32 slices shown, 12 images]
[im 3/32  brain]
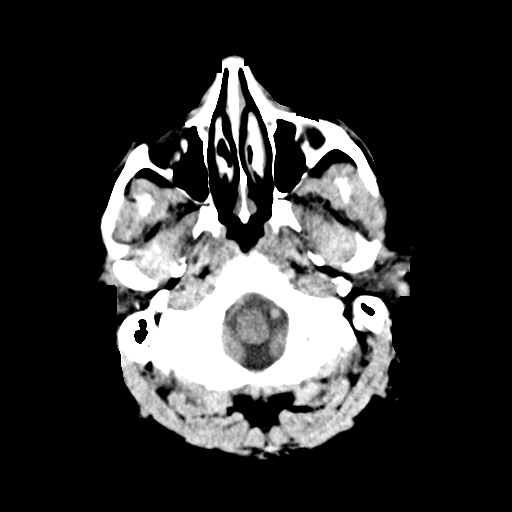
[im 3/32  bone]
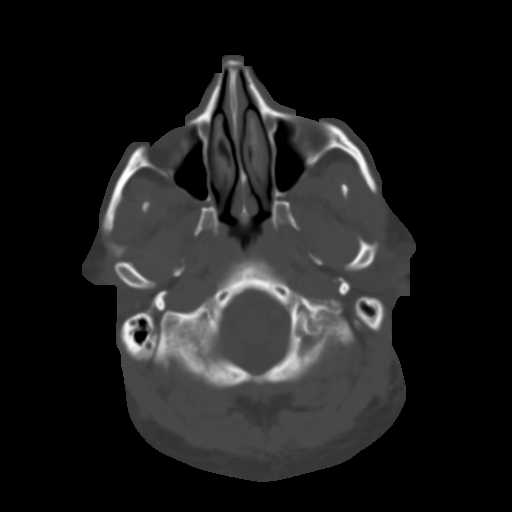
[im 6/32  brain]
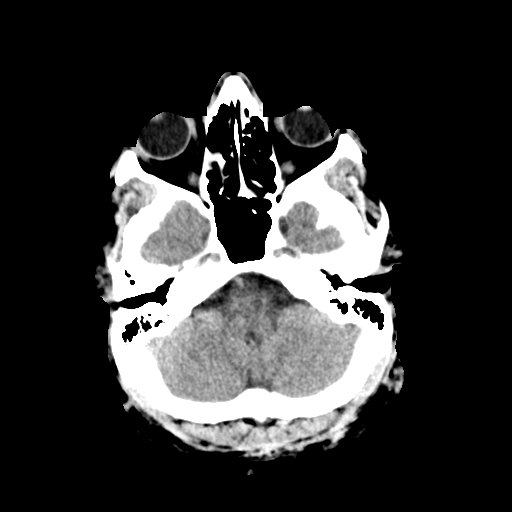
[im 9/32  brain]
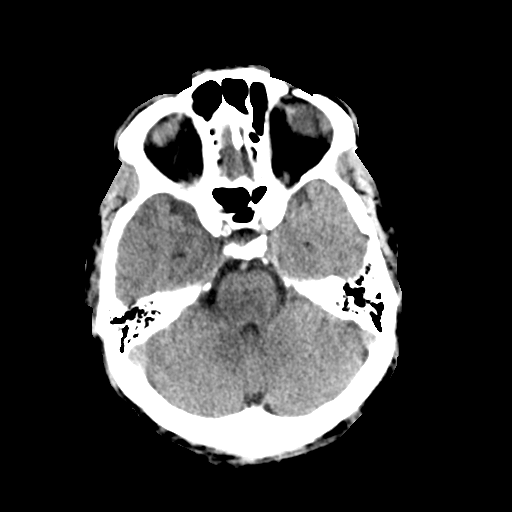
[im 12/32  brain]
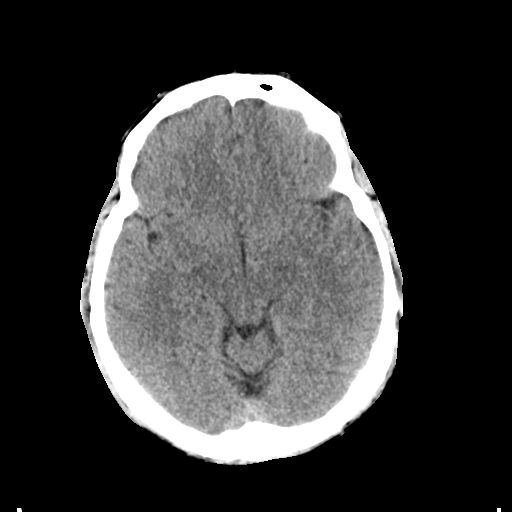
[im 17/32  brain]
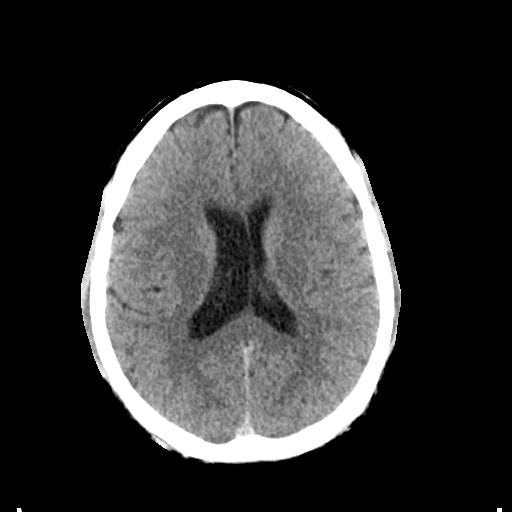
[im 17/32  bone]
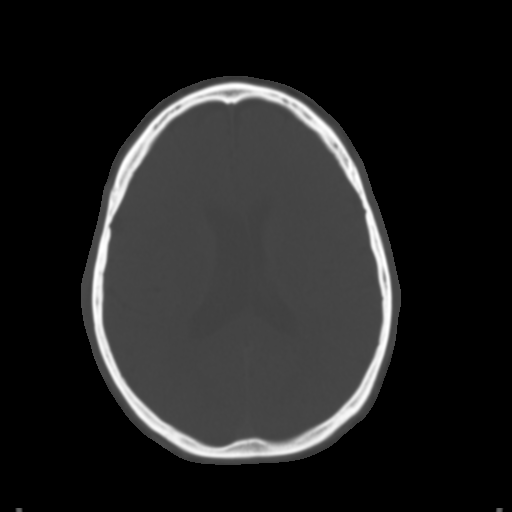
[im 20/32  brain]
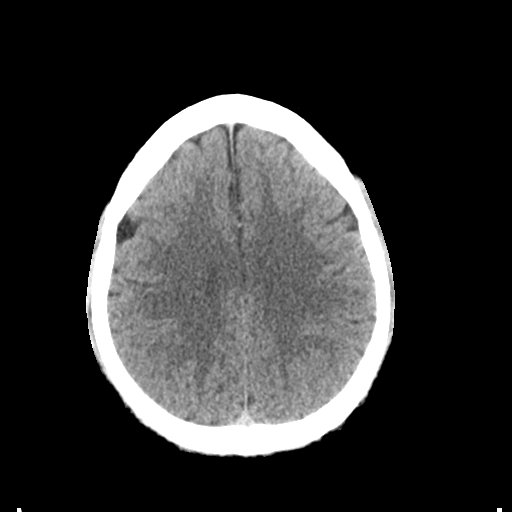
[im 23/32  brain]
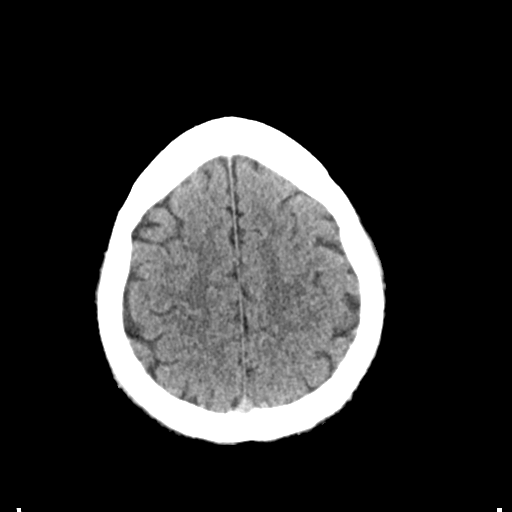
[im 26/32  brain]
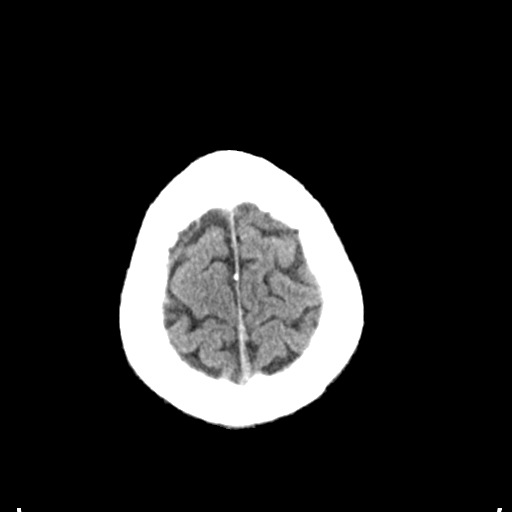
[im 29/32  brain]
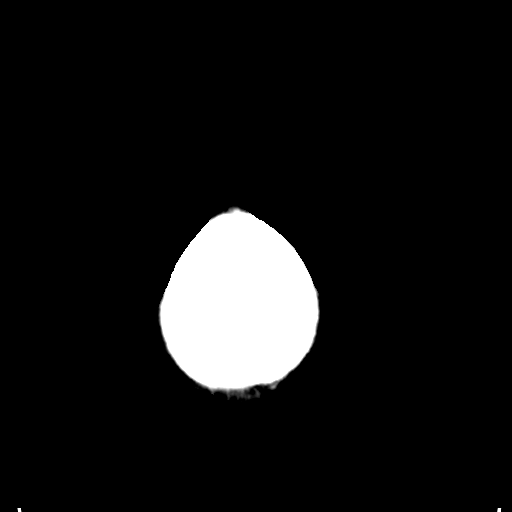
[im 29/32  bone]
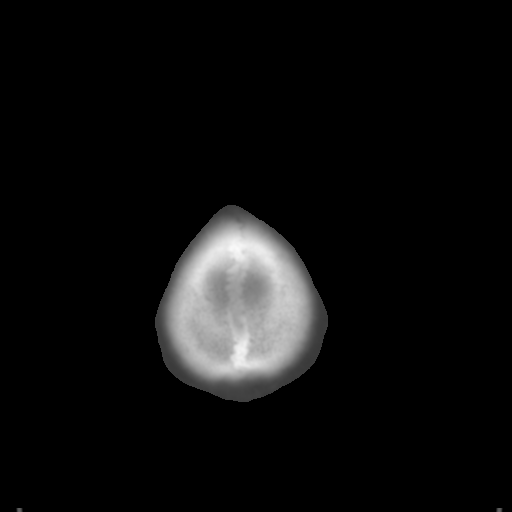

[Series 4: coronal soft tissue · coronal · 0.31mm/px · 3 of 66 slices shown]
[im 22/66  brain]
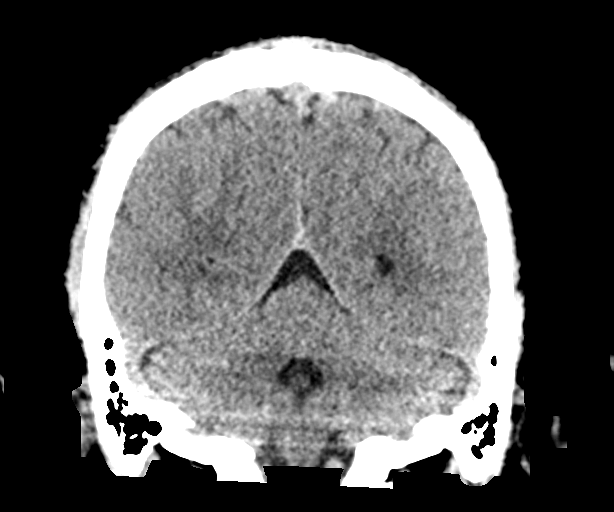
[im 29/66  brain]
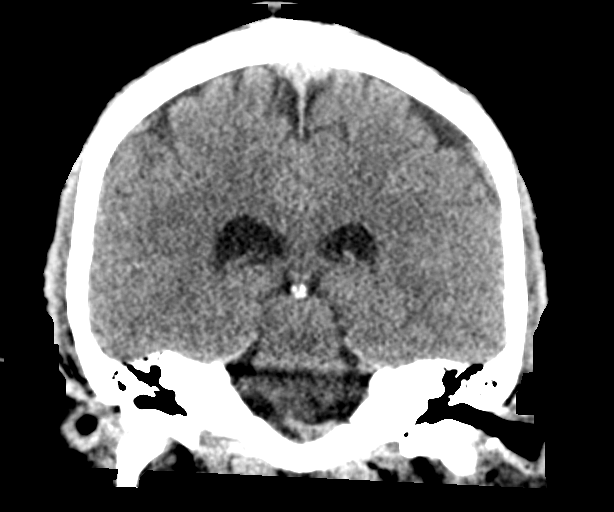
[im 37/66  brain]
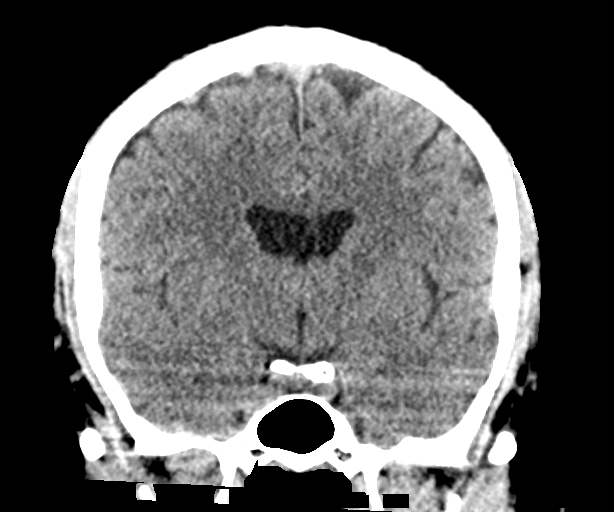

[Series 5: sagittal soft tissue · sagittal · 0.31mm/px · 3 of 55 slices shown]
[im 19/55  brain]
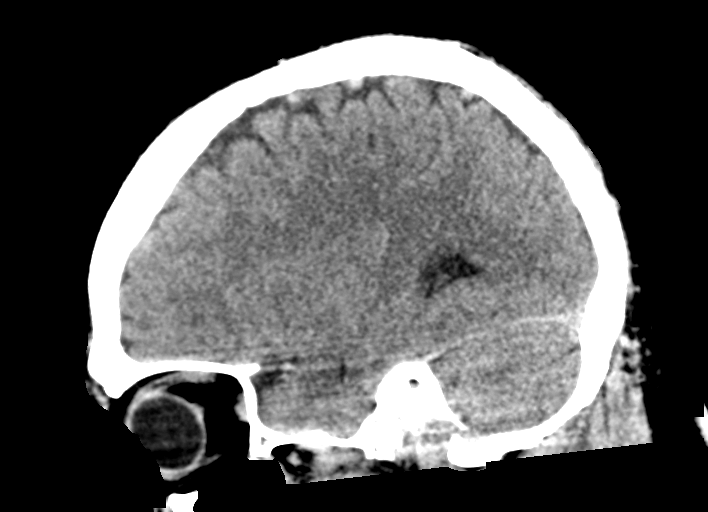
[im 28/55  brain]
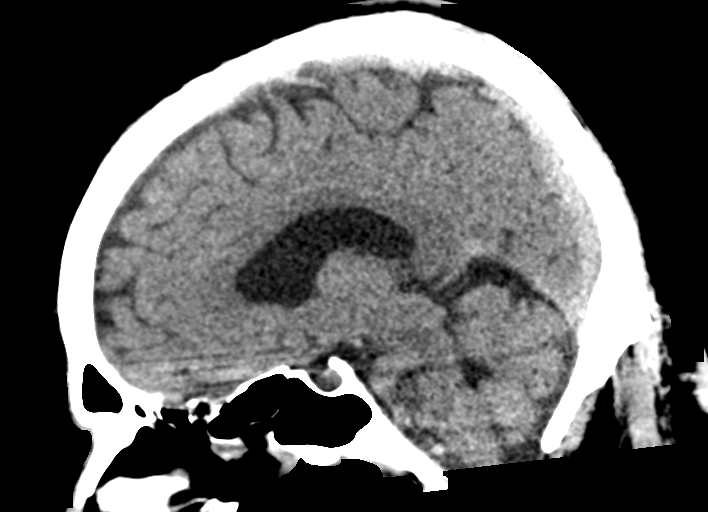
[im 37/55  brain]
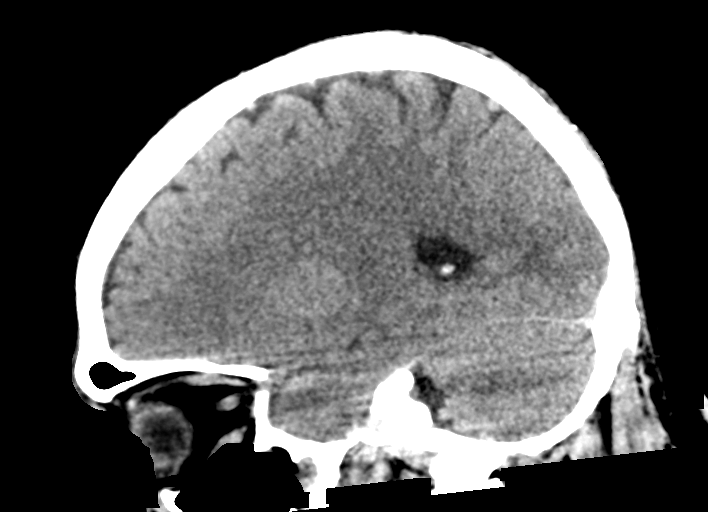

[15 of 47 positions shown; findings below may reference images not displayed]

FINDINGS: Brain: No evidence of acute infarction, hemorrhage, hydrocephalus,
extra-axial collection or mass lesion/mass effect.

Vascular: No hyperdense vessel or unexpected calcification.

Skull: Normal. Negative for fracture or focal lesion.

Sinuses/Orbits: No acute finding.

Other: None.
IMPRESSION: Normal head CT.
